# Patient Record
Sex: Male | Born: 1935 | Race: White | Hispanic: No | Marital: Married | Smoking: Former smoker
Health system: Southern US, Community
[De-identification: ages and names within clinical notes are randomized; demographics above are authoritative.]

## PROBLEM LIST (undated history)

## (undated) DIAGNOSIS — I2510 Atherosclerotic heart disease of native coronary artery without angina pectoris: Secondary | ICD-10-CM

## (undated) DIAGNOSIS — I10 Essential (primary) hypertension: Secondary | ICD-10-CM

## (undated) DIAGNOSIS — E119 Type 2 diabetes mellitus without complications: Secondary | ICD-10-CM

## (undated) DIAGNOSIS — K859 Acute pancreatitis without necrosis or infection, unspecified: Secondary | ICD-10-CM

## (undated) DIAGNOSIS — K7460 Unspecified cirrhosis of liver: Secondary | ICD-10-CM

## (undated) DIAGNOSIS — I714 Abdominal aortic aneurysm, without rupture, unspecified: Secondary | ICD-10-CM

## (undated) DIAGNOSIS — E785 Hyperlipidemia, unspecified: Secondary | ICD-10-CM

## (undated) DIAGNOSIS — I509 Heart failure, unspecified: Secondary | ICD-10-CM

## (undated) DIAGNOSIS — I4891 Unspecified atrial fibrillation: Secondary | ICD-10-CM

## (undated) DIAGNOSIS — R001 Bradycardia, unspecified: Secondary | ICD-10-CM

## (undated) HISTORY — PX: ENDOVASCULAR REPAIR/STENT GRAFT: CATH118280

## (undated) HISTORY — PX: CORONARY ARTERY BYPASS GRAFT: SHX141

## (undated) HISTORY — DX: Unspecified cirrhosis of liver: K74.60

## (undated) HISTORY — PX: COLONOSCOPY: SHX174

---

## 2004-06-28 ENCOUNTER — Ambulatory Visit: Payer: Self-pay | Admitting: Unknown Physician Specialty

## 2004-07-05 ENCOUNTER — Other Ambulatory Visit: Payer: Self-pay

## 2004-07-06 ENCOUNTER — Inpatient Hospital Stay: Payer: Self-pay | Admitting: Unknown Physician Specialty

## 2004-07-21 ENCOUNTER — Ambulatory Visit: Payer: Self-pay | Admitting: Ophthalmology

## 2004-07-28 ENCOUNTER — Ambulatory Visit: Payer: Self-pay | Admitting: Ophthalmology

## 2004-08-17 ENCOUNTER — Ambulatory Visit: Payer: Self-pay | Admitting: Internal Medicine

## 2004-09-02 ENCOUNTER — Ambulatory Visit: Payer: Self-pay | Admitting: Ophthalmology

## 2004-09-06 ENCOUNTER — Ambulatory Visit: Payer: Self-pay | Admitting: Internal Medicine

## 2004-09-08 ENCOUNTER — Ambulatory Visit: Payer: Self-pay | Admitting: Ophthalmology

## 2009-09-07 ENCOUNTER — Inpatient Hospital Stay: Payer: Self-pay | Admitting: Internal Medicine

## 2009-09-23 ENCOUNTER — Ambulatory Visit: Payer: Self-pay

## 2009-10-01 ENCOUNTER — Ambulatory Visit: Payer: Self-pay | Admitting: Unknown Physician Specialty

## 2009-11-11 ENCOUNTER — Ambulatory Visit: Payer: Self-pay | Admitting: Surgery

## 2009-11-13 LAB — PATHOLOGY REPORT

## 2012-08-06 ENCOUNTER — Ambulatory Visit: Payer: Self-pay | Admitting: Cardiology

## 2012-08-06 LAB — CBC WITH DIFFERENTIAL/PLATELET
Basophil %: 1.1 %
Eosinophil #: 0.2 10*3/uL (ref 0.0–0.7)
HCT: 34.4 % — ABNORMAL LOW (ref 40.0–52.0)
HGB: 11.3 g/dL — ABNORMAL LOW (ref 13.0–18.0)
Lymphocyte #: 0.8 10*3/uL — ABNORMAL LOW (ref 1.0–3.6)
MCH: 33.3 pg (ref 26.0–34.0)
MCHC: 33 g/dL (ref 32.0–36.0)
MCV: 101 fL — ABNORMAL HIGH (ref 80–100)
Monocyte #: 0.7 x10 3/mm (ref 0.2–1.0)
Neutrophil %: 61 %
WBC: 4.6 10*3/uL (ref 3.8–10.6)

## 2012-08-06 LAB — PROTIME-INR: INR: 1.2

## 2012-08-06 LAB — BASIC METABOLIC PANEL
Anion Gap: 4 — ABNORMAL LOW (ref 7–16)
Calcium, Total: 8.2 mg/dL — ABNORMAL LOW (ref 8.5–10.1)
Chloride: 106 mmol/L (ref 98–107)
Co2: 29 mmol/L (ref 21–32)
Creatinine: 1.13 mg/dL (ref 0.60–1.30)
EGFR (African American): >60
Glucose: 106 mg/dL — ABNORMAL HIGH (ref 65–99)
Osmolality: 280 (ref 275–301)
Potassium: 4.6 mmol/L (ref 3.5–5.1)
Sodium: 139 mmol/L (ref 136–145)

## 2012-08-09 ENCOUNTER — Ambulatory Visit: Payer: Self-pay | Admitting: Cardiology

## 2014-08-29 NOTE — Op Note (Signed)
PATIENT NAME:  Kyle Rollins, Kyle Rollins MR#:  [id number] DATE OF BIRTH:  02/16/1928  DATE OF PROCEDURE:  08/09/2012  PRIMARY CARE PHYSICIAN: Reola MosherAndrew S. Randa LynnLamb, MD  PREPROCEDURE DIAGNOSIS: Sick sinus syndrome, atrial fibrillation.   PROCEDURE: Single-chamber pacemaker implantation.   POSTPROCEDURE DIAGNOSIS: Ventricular pacing.   INDICATION: The patient is an 81 year old gentleman with known history of coronary artery disease. The patient has chronic atrial fibrillation. Recently, the patient has been experiencing generalized fatigue and exertional dyspnea. The patient was noted to be bradycardic, in atrial fibrillation. Recent Holter monitor revealed a mean heart rate of 51 bpm with a maximum heart rate of only 62 bpm. The risks, benefits and alternatives of permanent pacemaker implantation were explained to the patient, and informed written consent was obtained.   DESCRIPTION OF PROCEDURE: He was brought to the operating room in a fasting state. The left pectoral region was prepped and draped in the usual sterile manner. Anesthesia was obtained with 1% Xylocaine locally. A 6 cm incision was performed over the left pectoral region. The pacemaker pocket was generated by electrocautery and blunt dissection. Access was obtained to the left subclavian vein by fine needle aspiration. A Medtronic ventricular lead (5076) was positioned into the right ventricular apex. After proper threshold was obtained, the lead was sutured in place. The pacemaker pocket was irrigated with gentamicin solution. The lead was connected to a rate-responsive single-chamber pacemaker generator (Medtronic Adapta ADSR-1) and positioned into the pocket. The pocket was closed with 2-0 and 4-0 Vicryl, respectively. Steri-Strips and pressure dressing were applied.   ____________________________ Marcina MillardAlexander Kalup Jaquith, MD ap:OSi D: 08/09/2012 13:34:26 ET T: 08/09/2012 13:47:42 ET JOB#: [id number]  cc: Marcina MillardAlexander Camren Lipsett, MD, <Dictator> Marcina MillardALEXANDER  Solomia Harrell MD ELECTRONICALLY SIGNED 08/14/2012 12:38

## 2017-03-03 ENCOUNTER — Inpatient Hospital Stay
Admission: EM | Admit: 2017-03-03 | Discharge: 2017-03-06 | DRG: 292 | Disposition: A | Discharge status: Home Health | Payer: Medicare Other | Attending: Internal Medicine | Admitting: Internal Medicine

## 2017-03-03 ENCOUNTER — Emergency Department: Payer: Medicare Other

## 2017-03-03 ENCOUNTER — Encounter: Payer: Self-pay | Admitting: Intensive Care

## 2017-03-03 DIAGNOSIS — I25119 Atherosclerotic heart disease of native coronary artery with unspecified angina pectoris: Secondary | ICD-10-CM | POA: Diagnosis present

## 2017-03-03 DIAGNOSIS — I110 Hypertensive heart disease with heart failure: Secondary | ICD-10-CM | POA: Diagnosis not present

## 2017-03-03 DIAGNOSIS — I4891 Unspecified atrial fibrillation: Secondary | ICD-10-CM | POA: Diagnosis present

## 2017-03-03 DIAGNOSIS — Z87891 Personal history of nicotine dependence: Secondary | ICD-10-CM | POA: Diagnosis not present

## 2017-03-03 DIAGNOSIS — Z8249 Family history of ischemic heart disease and other diseases of the circulatory system: Secondary | ICD-10-CM

## 2017-03-03 DIAGNOSIS — Z951 Presence of aortocoronary bypass graft: Secondary | ICD-10-CM | POA: Diagnosis not present

## 2017-03-03 DIAGNOSIS — N179 Acute kidney failure, unspecified: Secondary | ICD-10-CM | POA: Diagnosis present

## 2017-03-03 DIAGNOSIS — Z950 Presence of cardiac pacemaker: Secondary | ICD-10-CM | POA: Diagnosis not present

## 2017-03-03 DIAGNOSIS — E785 Hyperlipidemia, unspecified: Secondary | ICD-10-CM | POA: Diagnosis present

## 2017-03-03 DIAGNOSIS — R531 Weakness: Secondary | ICD-10-CM

## 2017-03-03 DIAGNOSIS — I5023 Acute on chronic systolic (congestive) heart failure: Secondary | ICD-10-CM | POA: Diagnosis present

## 2017-03-03 DIAGNOSIS — Z7901 Long term (current) use of anticoagulants: Secondary | ICD-10-CM | POA: Diagnosis not present

## 2017-03-03 DIAGNOSIS — Z79899 Other long term (current) drug therapy: Secondary | ICD-10-CM | POA: Diagnosis not present

## 2017-03-03 DIAGNOSIS — I5043 Acute on chronic combined systolic (congestive) and diastolic (congestive) heart failure: Secondary | ICD-10-CM | POA: Diagnosis present

## 2017-03-03 DIAGNOSIS — I255 Ischemic cardiomyopathy: Secondary | ICD-10-CM | POA: Diagnosis present

## 2017-03-03 HISTORY — DX: Heart failure, unspecified: I50.9

## 2017-03-03 HISTORY — DX: Hyperlipidemia, unspecified: E78.5

## 2017-03-03 LAB — CBC WITH DIFFERENTIAL/PLATELET
Basophils Absolute: 0 10*3/uL (ref 0–0.1)
Basophils Relative: 1 %
Eosinophils Absolute: 0.1 10*3/uL (ref 0–0.7)
Eosinophils Relative: 2 %
HEMATOCRIT: 35.9 % — AB (ref 40.0–52.0)
HEMOGLOBIN: 12 g/dL — AB (ref 13.0–18.0)
LYMPHS ABS: 0.5 10*3/uL — AB (ref 1.0–3.6)
LYMPHS PCT: 9 %
MCH: 37.2 pg — AB (ref 26.0–34.0)
MCHC: 33.4 g/dL (ref 32.0–36.0)
MCV: 111.4 fL — AB (ref 80.0–100.0)
MONOS PCT: 12 %
Monocytes Absolute: 0.7 10*3/uL (ref 0.2–1.0)
NEUTROS ABS: 4.1 10*3/uL (ref 1.4–6.5)
NEUTROS PCT: 76 %
Platelets: 104 10*3/uL — ABNORMAL LOW (ref 150–440)
RBC: 3.22 MIL/uL — ABNORMAL LOW (ref 4.40–5.90)
RDW: 15.3 % — ABNORMAL HIGH (ref 11.5–14.5)
WBC: 5.4 10*3/uL (ref 3.8–10.6)

## 2017-03-03 LAB — URINALYSIS, COMPLETE (UACMP) WITH MICROSCOPIC
Bacteria, UA: NONE SEEN
Bilirubin Urine: NEGATIVE
Glucose, UA: NEGATIVE mg/dL
Hgb urine dipstick: NEGATIVE
KETONES UR: NEGATIVE mg/dL
Leukocytes, UA: NEGATIVE
NITRITE: NEGATIVE
PH: 5 (ref 5.0–8.0)
Protein, ur: 30 mg/dL — AB
SPECIFIC GRAVITY, URINE: 1.021 (ref 1.005–1.030)

## 2017-03-03 LAB — COMPREHENSIVE METABOLIC PANEL
ALBUMIN: 2.6 g/dL — AB (ref 3.5–5.0)
ALT: 17 U/L (ref 17–63)
AST: 49 U/L — AB (ref 15–41)
Alkaline Phosphatase: 60 U/L (ref 38–126)
Anion gap: 9 (ref 5–15)
BUN: 30 mg/dL — AB (ref 6–20)
CHLORIDE: 101 mmol/L (ref 101–111)
CO2: 25 mmol/L (ref 22–32)
CREATININE: 1.32 mg/dL — AB (ref 0.61–1.24)
Calcium: 8.2 mg/dL — ABNORMAL LOW (ref 8.9–10.3)
GFR calc Af Amer: 54 mL/min — ABNORMAL LOW (ref >60)
GFR calc non Af Amer: 47 mL/min — ABNORMAL LOW (ref >60)
GLUCOSE: 138 mg/dL — AB (ref 65–99)
POTASSIUM: 3.6 mmol/L (ref 3.5–5.1)
Sodium: 135 mmol/L (ref 135–145)
Total Bilirubin: 0.8 mg/dL (ref 0.3–1.2)
Total Protein: 7.2 g/dL (ref 6.5–8.1)

## 2017-03-03 LAB — BRAIN NATRIURETIC PEPTIDE: B Natriuretic Peptide: 1025 pg/mL — ABNORMAL HIGH (ref 0.0–100.0)

## 2017-03-03 LAB — PROTIME-INR
INR: 1.29
Prothrombin Time: 16 seconds — ABNORMAL HIGH (ref 11.4–15.2)

## 2017-03-03 LAB — TROPONIN I: Troponin I: <0.03 ng/mL (ref <0.03)

## 2017-03-03 MED ORDER — FUROSEMIDE 10 MG/ML IJ SOLN
40.0000 mg | Freq: Once | INTRAMUSCULAR | Status: AC
  Administered 2017-03-03: 40 mg via INTRAVENOUS
  Filled 2017-03-03: qty 4

## 2017-03-03 NOTE — ED Notes (Signed)
Pt given urinal to uriante and instructed not to stand.

## 2017-03-03 NOTE — ED Provider Notes (Signed)
[facility] Emergency Department Provider Note   ____________________________________________   First MD Initiated Contact with Patient 03/03/17 1846     (approximate)  I have reviewed the triage vital signs and the nursing notes.   HISTORY  Chief Complaint Weakness    HPI Kyle FETCH Sr. is a 83 y.o. male Reports 3 weeks of feeling progressively weaker and short of breath when he walks. He is having trouble moving around his legs are somewhat swollen also reports his dog scratched him on the left arm and not swollen with left elbow is swollen is not running a fever he is coughing up some yellow phlegm from time to time he denies any chest pain or tightness   Past Medical History:  Diagnosis Date  . CHF (congestive heart failure) (HCC)   . Hyperlipidemia     There are no active problems to display for this patient.   History reviewed. No pertinent surgical history.  Prior to Admission medications   Not on File    Allergies Patient has no known allergies.  History reviewed. No pertinent family history.  Social History Social History  Substance Use Topics  . Smoking status: Former Smoker    Types: Cigarettes  . Smokeless tobacco: Former Neurosurgeon    Types: Snuff  . Alcohol use No    Review of Systems  Constitutional: No fever/chills Eyes: No visual changes. ENT: No sore throat. Cardiovascular: Denies chest pain. Respiratory: some shortness of breath. Gastrointestinal: No abdominal pain.  No nausea, no vomiting.  No diarrhea.  No constipation. Genitourinary: Negative for dysuria. Musculoskeletal: Negative for back pain. Skin: Negative for rash. Neurological: Negative for headaches, focal weakness    ____________________________________________   PHYSICAL EXAM:  VITAL SIGNS: ED Triage Vitals  Enc Vitals Group     BP 03/03/17 1841 (!) 143/76     Pulse Rate 03/03/17 1841 78     Resp 03/03/17 1841 (!) 22     Temp 03/03/17  1841 97.9 F (36.6 C)     Temp Source 03/03/17 1841 Oral     SpO2 03/03/17 1841 97 %     Weight 03/03/17 1841 188 lb (85.3 kg)     Height 03/03/17 1841 5\' 5"  (1.651 m)     Head Circumference --      Peak Flow --      Pain Score 03/03/17 1840 5     Pain Loc --      Pain Edu? --      Excl. in GC? --     Constitutional: Alert and oriented. Well appearing and in no acute distress. Eyes: Conjunctivae are normal. I. Head: Atraumatic. Nose: No congestion/rhinnorhea. Mouth/Throat: Mucous membranes are moist.  Oropharynx non-erythematous. Neck: No stridor.   Cardiovascular: Normal rate, regular rhythm. Grossly normal heart sounds.  Good peripheral circulation. Respiratory: Normal respiratory effort.  No retractions. Lungs crackles two thirds of the way up left base has decreased breath sounds Gastrointestinal: Soft and nontender. No distention. No abdominal bruits. No CVA tenderness. Musculoskeletal: No lower extremity tenderness positive edema bilaterally  Neurologic:  Normal speech and language. No gross focal neurologic deficits are appreciated. Skin:  Skin is warm, dry and intact. No rash noted. Psychiatric: Mood and affect are normal. Speech and behavior are normal.  ____________________________________________   LABS (all labs ordered are listed, but only abnormal results are displayed)  Labs Reviewed  COMPREHENSIVE METABOLIC PANEL - Abnormal; Notable for the following:       Result Value  Glucose, Bld 138 (*)    BUN 30 (*)    Creatinine, Ser 1.32 (*)    Calcium 8.2 (*)    Albumin 2.6 (*)    AST 49 (*)    GFR calc non Af Amer 47 (*)    GFR calc Af Amer 54 (*)    All other components within normal limits  BRAIN NATRIURETIC PEPTIDE - Abnormal; Notable for the following:    B Natriuretic Peptide 1,025.0 (*)    All other components within normal limits  CBC WITH DIFFERENTIAL/PLATELET - Abnormal; Notable for the following:    RBC 3.22 (*)    Hemoglobin 12.0 (*)    HCT 35.9  (*)    MCV 111.4 (*)    MCH 37.2 (*)    RDW 15.3 (*)    Platelets 104 (*)    Lymphs Abs 0.5 (*)    All other components within normal limits  URINALYSIS, COMPLETE (UACMP) WITH MICROSCOPIC - Abnormal; Notable for the following:    Color, Urine YELLOW (*)    APPearance CLEAR (*)    Protein, ur 30 (*)    Squamous Epithelial / LPF 0-5 (*)    All other components within normal limits  PROTIME-INR - Abnormal; Notable for the following:    Prothrombin Time 16.0 (*)    All other components within normal limits  TROPONIN I   ____________________________________________  EKG  EKG read and interpreted by me shows a paced rhythm rate of 80 no acute changes are seen ____________________________________________  RADIOLOGY    ____________________________________________   PROCEDURES  Procedure(s) performed:   Procedures  Critical Care performed:  ____________________________________________   INITIAL IMPRESSION / ASSESSMENT AND PLAN / ED COURSE  As part of my medical decision making, I reviewed the following data within the electronic MEDICAL RECORD NUMBER     patient's past medical history from care everywhere [facility] was reviewed the shows he has shortness of breath pacemaker diabetes atherosclerosis hypertension hyperlipidemia: Polyps and AAA pancreatitis congestive failure A. fib etc. patient is too weak to get up out of the bed even with the help from the nurse his leg is weepy on attempting to sit up he got dizzy and short of breath although he did not desaturate. He cannot be cared for at home in his present condition. He'll attempt to reduce his fluid load with some Lasix and admission.   ____________________________________________   FINAL CLINICAL IMPRESSION(S) / ED DIAGNOSES  Final diagnoses:  Weakness  Acute on chronic combined systolic and diastolic congestive heart failure (HCC)      NEW MEDICATIONS STARTED DURING THIS VISIT:  New Prescriptions   No medications  on file     Note:  This document was prepared using Dragon voice recognition software and may include unintentional dictation errors.    Arnaldo NatalMalinda, Jayla Mackie F, MD 03/03/17 2113

## 2017-03-03 NOTE — ED Notes (Signed)
Family at bedside. 

## 2017-03-03 NOTE — ED Notes (Signed)
Pants removed and pt assisted to standing to the side of the bed with this RN and pt son. Pt having a difficult time. Pt output is 30ml and is dark in color. Pt has 2 plus pitting edema to lower extremities bilaterally. Wife states pt has not been drinking like he should.

## 2017-03-03 NOTE — ED Triage Notes (Signed)
Patient arrived by EMS from home for c/o weakness to extremities X3 weeks that has gradually gotten worse. Patient saw PCP recently and he told patient to come to ER. Patient hs labored breathing on and off. Denies N/V/D. Reports decreased appetite. No fevers. EMS blood sugar 159. Patient takes warfarin daily but his wife states he hasnt taken it in 5 days. HX [facility]

## 2017-03-03 NOTE — ED Notes (Signed)
Attempted to get pt to stand to see how his sats would tolerate. Pt unable to get to a sitting position with this RN assistance. Pt got dizzy and weak. Weeping noted to the posterior right leg. O2 sats remained in the 90s however pt is too weak to stand.

## 2017-03-03 NOTE — ED Notes (Signed)
Patient is resting comfortably. 

## 2017-03-04 LAB — BASIC METABOLIC PANEL
Anion gap: 9 (ref 5–15)
BUN: 29 mg/dL — ABNORMAL HIGH (ref 6–20)
CO2: 24 mmol/L (ref 22–32)
Calcium: 8 mg/dL — ABNORMAL LOW (ref 8.9–10.3)
Chloride: 104 mmol/L (ref 101–111)
Creatinine, Ser: 1.07 mg/dL (ref 0.61–1.24)
GFR calc Af Amer: >60 mL/min (ref >60)
GFR calc non Af Amer: >60 mL/min (ref >60)
Glucose, Bld: 93 mg/dL (ref 65–99)
Potassium: 3.5 mmol/L (ref 3.5–5.1)
Sodium: 137 mmol/L (ref 135–145)

## 2017-03-04 LAB — PROTIME-INR
INR: 1.37
Prothrombin Time: 16.8 seconds — ABNORMAL HIGH (ref 11.4–15.2)

## 2017-03-04 LAB — TSH: TSH: 4.09 u[IU]/mL (ref 0.350–4.500)

## 2017-03-04 MED ORDER — ISOSORBIDE DINITRATE 30 MG PO TABS
30.0000 mg | ORAL_TABLET | Freq: Three times a day (TID) | ORAL | Status: DC
  Administered 2017-03-04 – 2017-03-06 (×8): 30 mg via ORAL
  Filled 2017-03-04 (×9): qty 1

## 2017-03-04 MED ORDER — FUROSEMIDE 10 MG/ML IJ SOLN
60.0000 mg | Freq: Four times a day (QID) | INTRAMUSCULAR | Status: DC
  Administered 2017-03-04: 60 mg via INTRAVENOUS
  Filled 2017-03-04: qty 6

## 2017-03-04 MED ORDER — WARFARIN - PHARMACIST DOSING INPATIENT: Freq: Every day | Status: DC

## 2017-03-04 MED ORDER — FUROSEMIDE 10 MG/ML IJ SOLN
20.0000 mg | Freq: Three times a day (TID) | INTRAMUSCULAR | Status: DC
  Administered 2017-03-04 – 2017-03-06 (×7): 20 mg via INTRAVENOUS
  Filled 2017-03-04 (×7): qty 2

## 2017-03-04 MED ORDER — ACETAMINOPHEN 650 MG RE SUPP: 650.0000 mg | Freq: Four times a day (QID) | RECTAL | Status: DC | PRN

## 2017-03-04 MED ORDER — WARFARIN SODIUM 1 MG PO TABS: 1.0000 mg | ORAL_TABLET | ORAL | Status: DC

## 2017-03-04 MED ORDER — WARFARIN SODIUM 2 MG PO TABS: 2.0000 mg | ORAL_TABLET | ORAL | Status: DC

## 2017-03-04 MED ORDER — WARFARIN SODIUM 2 MG PO TABS
2.0000 mg | ORAL_TABLET | ORAL | Status: DC
  Administered 2017-03-04: 2 mg via ORAL
  Filled 2017-03-04 (×2): qty 1

## 2017-03-04 MED ORDER — ONDANSETRON HCL 4 MG PO TABS: 4.0000 mg | ORAL_TABLET | Freq: Four times a day (QID) | ORAL | Status: DC | PRN

## 2017-03-04 MED ORDER — WARFARIN SODIUM 2 MG PO TABS: 2.0000 mg | ORAL_TABLET | Freq: Every day | ORAL | Status: DC

## 2017-03-04 MED ORDER — PRAVASTATIN SODIUM 40 MG PO TABS
80.0000 mg | ORAL_TABLET | Freq: Every day | ORAL | Status: DC
  Administered 2017-03-04 – 2017-03-05 (×2): 80 mg via ORAL
  Filled 2017-03-04 (×2): qty 2

## 2017-03-04 MED ORDER — NITROGLYCERIN 0.4 MG SL SUBL
0.4000 mg | SUBLINGUAL_TABLET | SUBLINGUAL | Status: DC | PRN
  Administered 2017-03-04 – 2017-03-05 (×2): 0.4 mg via SUBLINGUAL
  Filled 2017-03-04 (×2): qty 1

## 2017-03-04 MED ORDER — DOCUSATE SODIUM 100 MG PO CAPS
100.0000 mg | ORAL_CAPSULE | Freq: Two times a day (BID) | ORAL | Status: DC
  Administered 2017-03-04 – 2017-03-06 (×5): 100 mg via ORAL
  Filled 2017-03-04 (×6): qty 1

## 2017-03-04 MED ORDER — BACITRACIN ZINC 500 UNIT/GM EX OINT
1.0000 "application " | TOPICAL_OINTMENT | Freq: Three times a day (TID) | CUTANEOUS | Status: DC
  Administered 2017-03-04 – 2017-03-06 (×6): 1 via TOPICAL
  Filled 2017-03-04 (×2): qty 28.35

## 2017-03-04 MED ORDER — HEPARIN SODIUM (PORCINE) 5000 UNIT/ML IJ SOLN
5000.0000 [IU] | Freq: Three times a day (TID) | INTRAMUSCULAR | Status: DC
  Administered 2017-03-04 – 2017-03-06 (×7): 5000 [IU] via SUBCUTANEOUS
  Filled 2017-03-04 (×7): qty 1

## 2017-03-04 MED ORDER — SODIUM CHLORIDE 0.9 % IV SOLN: 250.0000 mL | INTRAVENOUS | Status: DC | PRN

## 2017-03-04 MED ORDER — SODIUM CHLORIDE 0.9% FLUSH: 3.0000 mL | INTRAVENOUS | Status: DC | PRN

## 2017-03-04 MED ORDER — ACETAMINOPHEN 325 MG PO TABS
650.0000 mg | ORAL_TABLET | Freq: Four times a day (QID) | ORAL | Status: DC | PRN
  Administered 2017-03-04 – 2017-03-05 (×2): 650 mg via ORAL
  Filled 2017-03-04 (×2): qty 2

## 2017-03-04 MED ORDER — ONDANSETRON HCL 4 MG/2ML IJ SOLN: 4.0000 mg | Freq: Four times a day (QID) | INTRAMUSCULAR | Status: DC | PRN

## 2017-03-04 MED ORDER — METOPROLOL TARTRATE 25 MG PO TABS
25.0000 mg | ORAL_TABLET | Freq: Two times a day (BID) | ORAL | Status: DC
  Administered 2017-03-04 – 2017-03-06 (×6): 25 mg via ORAL
  Filled 2017-03-04 (×6): qty 1

## 2017-03-04 MED ORDER — SODIUM CHLORIDE 0.9% FLUSH
3.0000 mL | Freq: Two times a day (BID) | INTRAVENOUS | Status: DC
  Administered 2017-03-04 – 2017-03-06 (×4): 3 mL via INTRAVENOUS

## 2017-03-04 NOTE — Progress Notes (Signed)
ANTICOAGULATION CONSULT NOTE - Initial Consult  Pharmacy Consult for warfarin Indication: atrial fibrillation  No Known Allergies  Patient Measurements: Height: 5\' 5"  (165.1 cm) Weight: 182 lb 4.8 oz (82.7 kg) IBW/kg (Calculated) : 61.5 Heparin Dosing Weight: 82.7 kg  Vital Signs: Temp: 97.6 F (36.4 C) (10/27 0042) Temp Source: Oral (10/27 0042) BP: 120/66 (10/27 0042) Pulse Rate: 67 (10/27 0042)  Labs:  Recent Labs  03/03/17 1848  HGB 12.0*  HCT 35.9*  PLT 104*  LABPROT 16.0*  INR 1.29  CREATININE 1.32*  TROPONINI <0.03    Estimated Creatinine Clearance: 39 mL/min (A) (by C-G formula based on SCr of 1.32 mg/dL (H)).   Medical History: Past Medical History:  Diagnosis Date  . CHF (congestive heart failure) (HCC)   . Hyperlipidemia     Medications:  Scheduled:  . bacitracin  1 application Topical TID  . docusate sodium  100 mg Oral BID  . furosemide  60 mg Intravenous Q6H  . heparin  5,000 Units Subcutaneous Q8H  . isosorbide dinitrate  30 mg Oral TID  . metoprolol tartrate  25 mg Oral BID  . pravastatin  80 mg Oral q1800  . [START ON 03/06/2017] warfarin  1 mg Oral Q Mon-1800   And  . [START ON 03/08/2017] warfarin  1 mg Oral Q Wed-1800  . warfarin  2 mg Oral Q T,Th,S,Su-1800   And  . [START ON 03/10/2017] warfarin  2 mg Oral Q Fri-1800  . Warfarin - Pharmacist Dosing Inpatient   Does not apply q1800    Assessment: Patient admitted for weakness and SOB shown to have some congestion on CXR. Patient takes warfarin PTA for chronic afib. Warfarin 2 mg TThFSSun Warfarin 1 mg MW  10/26 INR 1.29 INR is currently therapeutic.  Goal of Therapy:  INR 2-3 Monitor platelets by anticoagulation protocol: Yes   Plan:  Will restart patient's warfarin 2mg  daily and warfarin 1mg  on MW. Will monitor CBC and INRs daily. Will adjusted based on INRs.  Thomasene Rippleavid Zamiah Tollett, PharmD, BCPS Clinical Pharmacist 03/04/2017

## 2017-03-04 NOTE — Progress Notes (Signed)
[facility] [place] [facility]   PATIENT NAME: Kyle Rollins    MR#:  [id number]  DATE OF BIRTH:  [birth year]  SUBJECTIVE:   Came in with weakness and increasing sob REVIEW OF SYSTEMS:   Review of Systems  Constitutional: Negative for chills, fever and weight loss.  HENT: Negative for ear discharge, ear pain and nosebleeds.   Eyes: Negative for blurred vision, pain and discharge.  Respiratory: Positive for shortness of breath. Negative for sputum production, wheezing and stridor.   Cardiovascular: Positive for leg swelling. Negative for chest pain, palpitations, orthopnea and PND.  Gastrointestinal: Negative for abdominal pain, diarrhea, nausea and vomiting.  Genitourinary: Negative for frequency and urgency.  Musculoskeletal: Negative for back pain and joint pain.  Neurological: Positive for weakness. Negative for sensory change, speech change and focal weakness.  Psychiatric/Behavioral: Negative for depression and hallucinations. The patient is not nervous/anxious.    Tolerating Diet: Tolerating PT:   DRUG ALLERGIES:  No Known Allergies  VITALS:  Blood pressure (!) 120/55, pulse 66, temperature 97.7 F (36.5 C), temperature source Oral, resp. rate 18, height 5\' 5"  (1.651 m), weight 82.6 kg (182 lb), SpO2 95 %.  PHYSICAL EXAMINATION:   Physical Exam  GENERAL:  83 y.o.-year-old patient lying in the bed with no acute distress.  EYES: Pupils equal, round, reactive to light and accommodation. No scleral icterus. Extraocular muscles intact.  HEENT: Head atraumatic, normocephalic. Oropharynx and nasopharynx clear.  NECK:  Supple, no jugular venous distention. No thyroid enlargement, no tenderness.  LUNGS: decreasedl breath sounds bilaterally, no wheezing, rales, rhonchi. No use of accessory muscles of respiration.  CARDIOVASCULAR: S1, S2 normal. No murmurs, rubs, or gallops.  ABDOMEN: Soft, nontender, nondistended. Bowel sounds present. No  organomegaly or mass. Scrotal edema EXTREMITIES: No cyanosis, clubbing or edema b/l.    NEUROLOGIC: Cranial nerves II through XII are intact. No focal Motor or sensory deficits b/l.   PSYCHIATRIC:  patient is alert and oriented x 3.  SKIN: No obvious rash, lesion, or ulcer.   LABORATORY PANEL:  CBC  Recent Labs Lab 03/03/17 1848  WBC 5.4  HGB 12.0*  HCT 35.9*  PLT 104*    Chemistries   Recent Labs Lab 03/03/17 1848 03/04/17 0634  NA 135 137  K 3.6 3.5  CL 101 104  CO2 25 24  GLUCOSE 138* 93  BUN 30* 29*  CREATININE 1.32* 1.07  CALCIUM 8.2* 8.0*  AST 49*  --   ALT 17  --   ALKPHOS 60  --   BILITOT 0.8  --    Cardiac Enzymes  Recent Labs Lab 03/03/17 1848  TROPONINI <0.03   RADIOLOGY:  Dg Chest Portable 1 View  Result Date: 03/03/2017 CLINICAL DATA:  Extremity weakness 3 weeks getting worse. Difficulty breathing intermittently. EXAM: PORTABLE CHEST 1 VIEW COMPARISON:  08/09/2012 and 08/06/2012 FINDINGS: Sternotomy wires unchanged. Left-sided pacemaker unchanged. Lungs are hypoinflated without lobar consolidation or effusion. Minimal prominence of the perihilar markings without significant change likely mild chronic vascular congestion. Cardiomediastinal silhouette is within normal. There is calcified plaque over the aortic arch. There are degenerative changes of the spine. IMPRESSION: Hypoinflated lungs with suggestion of minimal chronic vascular congestion. Electronically Signed   By: Elberta Fortis M.D.   On: 03/03/2017 19:16   ASSESSMENT AND PLAN:  83 year old male admitted for acute kidney injury.  1. AKI: with volume overload patient needs diuresis as well as improve cardiac output. We will avoid nephrotoxic agents.  -cont lasix for  now -creatinine better  2. CHF: Acute on chronic; systolic. The patient already has a biventricular pacemaker and ischemic cardiomyopathy. Continue IV Lasix. Consult cardiology with Dr Juliann Parescallwood noted  3. CAD: Stable; continue  Isordil  4. A. fib: Rate controlled; continue warfarin.  5. Hyperlipidemia: Continue statin therapy  6. DVT prophylaxis: As above on coumadin   Case discussed with Care Management/Social Worker. Management plans discussed with the patient, family and they are in agreement.  CODE STATUS: full  DVT Prophylaxis: coumadin  TOTAL TIME TAKING CARE OF THIS PATIENT: 25 minutes.  >50% time spent on counselling and coordination of care  POSSIBLE D/C IN *1-2* DAYS, DEPENDING ON CLINICAL CONDITION.  Note: This dictation was prepared with Dragon dictation along with smaller phrase technology. Any transcriptional errors that result from this process are unintentional.  Urijah Arko M.D on 03/04/2017 at 1:05 PM  Between 7am to 6pm - Pager - [phone]  After 6pm go to [email or web address] - Social research officer, [email or web address] [email or web address]  [facility]  Office  [phone]  CC: Primary care physician; Kandyce RudBabaoff, Marcus, MD

## 2017-03-04 NOTE — H&P (Signed)
Kyle Chandler [name].   Chief Complaint: Weakness HPI: The patient with past medical history of CHF and A. fib presents emergency department complaining of weakness. The patient has been unable to stand today. His mother and brother state that he has been progressively weaker over the last 2 days. The patient complains of leg swelling. He denies chest pain or shortness of breath. Laboratory evaluation in the emergency department showed elevated BNP. He was given Lasix IV in the emergency department to which she responded well. He was also found to have acute kidney injury which prompted emergency department staff to call the hospitalist service for admission.  Past Medical History:  Diagnosis Date  . CHF (congestive heart failure) ([place])   . Hyperlipidemia     Past Surgical History:  Procedure Laterality Date  . CORONARY ARTERY BYPASS GRAFT     x2  . ENDOVASCULAR REPAIR/STENT GRAFT      Family History  Problem Relation Age of Onset  . CAD Father    Social History:  reports that he has quit smoking. His smoking use included Cigarettes. He has quit using smokeless tobacco. His smokeless tobacco use included Snuff. He reports that he does not drink alcohol. His drug history is not on file.  Allergies: No Known Allergies  Medications Prior to Admission  Medication Sig Dispense Refill  . bacitracin ointment Apply 1 application topically 3 (three) times daily.    . furosemide (LASIX) 20 MG tablet Take 1 tablet by mouth daily.    . isosorbide dinitrate (ISORDIL) 30 MG tablet Take 1 tablet by mouth 3 (three) times daily.    Marland Kitchen lovastatin (MEVACOR) 40 MG tablet Take 2 tablets by mouth daily.     . metoprolol tartrate (LOPRESSOR) 25 MG tablet Take 1 tablet by mouth 2 (two) times daily.    . nitroGLYCERIN (NITROSTAT) 0.4 MG SL tablet Place 1 tablet under the tongue. Every 5 minutes as needed for chest pain. May take up to 3 doses.    Marland Kitchen warfarin (COUMADIN) 2 MG tablet Take 1  tablet by mouth daily. Break in half on Monday and Wednesday      Results for orders placed or performed during the hospital encounter of 03/03/17 (from the past 48 hour(s))  Comprehensive metabolic panel     Status: Abnormal   Collection Time: 03/03/17  6:48 PM  Result Value Ref Range   Sodium 135 135 - 145 mmol/L   Potassium 3.6 3.5 - 5.1 mmol/L   Chloride 101 101 - 111 mmol/L   CO2 25 22 - 32 mmol/L   Glucose, Bld 138 (H) 65 - 99 mg/dL   BUN 30 (H) 6 - 20 mg/dL   Creatinine, Ser 1.32 (H) 0.61 - 1.24 mg/dL   Calcium 8.2 (L) 8.9 - 10.3 mg/dL   Total Protein 7.2 6.5 - 8.1 g/dL   Albumin 2.6 (L) 3.5 - 5.0 g/dL   AST 49 (H) 15 - 41 U/L   ALT 17 17 - 63 U/L   Alkaline Phosphatase 60 38 - 126 U/L   Total Bilirubin 0.8 0.3 - 1.2 mg/dL   GFR calc non Af Amer 47 (L) >60 mL/min   GFR calc Af Amer 54 (L) >60 mL/min    Comment: (NOTE) The eGFR has been calculated using the CKD EPI equation. This calculation has not been validated in all clinical situations. eGFR's persistently <60 mL/min signify possible Chronic Kidney Disease.    Anion gap 9 5 -  15  Brain natriuretic peptide     Status: Abnormal   Collection Time: 03/03/17  6:48 PM  Result Value Ref Range   B Natriuretic Peptide 1,025.0 (H) 0.0 - 100.0 pg/mL  Troponin I     Status: None   Collection Time: 03/03/17  6:48 PM  Result Value Ref Range   Troponin I <0.03 <0.03 ng/mL  CBC with Differential     Status: Abnormal   Collection Time: 03/03/17  6:48 PM  Result Value Ref Range   WBC 5.4 3.8 - 10.6 K/uL   RBC 3.22 (L) 4.40 - 5.90 MIL/uL   Hemoglobin 12.0 (L) 13.0 - 18.0 g/dL   HCT 35.9 (L) 40.0 - 52.0 %   MCV 111.4 (H) 80.0 - 100.0 fL   MCH 37.2 (H) 26.0 - 34.0 pg   MCHC 33.4 32.0 - 36.0 g/dL   RDW 15.3 (H) 11.5 - 14.5 %   Platelets 104 (L) 150 - 440 K/uL   Neutrophils Relative % 76 %   Neutro Abs 4.1 1.4 - 6.5 K/uL   Lymphocytes Relative 9 %   Lymphs Abs 0.5 (L) 1.0 - 3.6 K/uL   Monocytes Relative 12 %   Monocytes  Absolute 0.7 0.2 - 1.0 K/uL   Eosinophils Relative 2 %   Eosinophils Absolute 0.1 0 - 0.7 K/uL   Basophils Relative 1 %   Basophils Absolute 0.0 0 - 0.1 K/uL  Protime-INR     Status: Abnormal   Collection Time: 03/03/17  6:48 PM  Result Value Ref Range   Prothrombin Time 16.0 (H) 11.4 - 15.2 seconds   INR 1.29   Urinalysis, Complete w Microscopic     Status: Abnormal   Collection Time: 03/03/17  7:28 PM  Result Value Ref Range   Color, Urine YELLOW (A) YELLOW   APPearance CLEAR (A) CLEAR   Specific Gravity, Urine 1.021 1.005 - 1.030   pH 5.0 5.0 - 8.0   Glucose, UA NEGATIVE NEGATIVE mg/dL   Hgb urine dipstick NEGATIVE NEGATIVE   Bilirubin Urine NEGATIVE NEGATIVE   Ketones, ur NEGATIVE NEGATIVE mg/dL   Protein, ur 30 (A) NEGATIVE mg/dL   Nitrite NEGATIVE NEGATIVE   Leukocytes, UA NEGATIVE NEGATIVE   RBC / HPF 0-5 0 - 5 RBC/hpf   WBC, UA 0-5 0 - 5 WBC/hpf   Bacteria, UA NONE SEEN NONE SEEN   Squamous Epithelial / LPF 0-5 (A) NONE SEEN   Mucus PRESENT    Hyaline Casts, UA PRESENT    Dg Chest Portable 1 View  Result Date: 03/03/2017 CLINICAL DATA:  Extremity weakness 3 weeks getting worse. Difficulty breathing intermittently. EXAM: PORTABLE CHEST 1 VIEW COMPARISON:  08/09/2012 and 08/06/2012 FINDINGS: Sternotomy wires unchanged. Left-sided pacemaker unchanged. Lungs are hypoinflated without lobar consolidation or effusion. Minimal prominence of the perihilar markings without significant change likely mild chronic vascular congestion. Cardiomediastinal silhouette is within normal. There is calcified plaque over the aortic arch. There are degenerative changes of the spine. IMPRESSION: Hypoinflated lungs with suggestion of minimal chronic vascular congestion. Electronically Signed   By: Marin Olp M.D.   On: 03/03/2017 19:16    Review of Systems  Constitutional: Negative for chills and fever.  HENT: Negative for sore throat and tinnitus.   Eyes: Negative for blurred vision and  redness.  Respiratory: Negative for cough and shortness of breath.   Cardiovascular: Positive for leg swelling. Negative for chest pain, palpitations, orthopnea and PND.  Gastrointestinal: Negative for abdominal pain, diarrhea, nausea and vomiting.  Genitourinary: Negative for dysuria, frequency and urgency.  Musculoskeletal: Negative for joint pain and myalgias.  Skin: Negative for rash.       No lesions  Neurological: Positive for weakness. Negative for speech change and focal weakness.  Endo/Heme/Allergies: Does not bruise/bleed easily.       No temperature intolerance  Psychiatric/Behavioral: Negative for depression and suicidal ideas.    Blood pressure 120/66, pulse 67, temperature 97.6 F (36.4 C), temperature source Oral, resp. rate 17, height 5' 5"  (1.651 m), weight 82.7 kg (182 lb 4.8 oz), SpO2 97 %. Physical Exam  Vitals reviewed. Constitutional: He is oriented to person, place, and time. He appears well-developed and well-nourished. No distress.  HENT:  Head: Normocephalic.  Eyes: Pupils are equal, round, and reactive to light. Conjunctivae and EOM are normal. No scleral icterus.  Neck: Normal range of motion. Neck supple. No JVD present. No tracheal deviation present. No thyromegaly present.  Cardiovascular: Normal rate and normal heart sounds.  An irregularly irregular rhythm present. Exam reveals no gallop and no friction rub.   No murmur heard. Respiratory: Effort normal and breath sounds normal. No respiratory distress.  GI: Soft. Bowel sounds are normal. He exhibits no distension. There is no tenderness.  Genitourinary:  Genitourinary Comments: Deferred  Musculoskeletal: Normal range of motion. He exhibits edema.  Lymphadenopathy:    He has no cervical adenopathy.  Neurological: He is alert and oriented to person, place, and time. No cranial nerve deficit.  Skin: Skin is warm and dry. No rash noted. No erythema.  Discoloration consistent with chronic venous stasis   Psychiatric: He has a normal mood and affect. His behavior is normal. Judgment and thought content normal.     Assessment/Plan This is an 83 year old male admitted for acute kidney injury. 1. AKI: Prerenal; patient needs diuresis as well as improve cardiac output. We will avoid nephrotoxic agents. Await cardiology input. 2. CHF: Acute on chronic; systolic. The patient already has a biventricular pacemaker and ischemic cardiomyopathy. Continue IV Lasix. Consult cardiology. 3. CAD: Stable; continue Isordil 4. A. fib: Rate controlled; continue warfarin. 5. Hyperlipidemia: Continue statin therapy 6. DVT prophylaxis: As above 7. GI prophylaxis: None The patient is a full code. Time spent on admission orders and patient care approximately 45 minutes  Harrie Foreman, MD 03/04/2017, 1:01 AM

## 2017-03-04 NOTE — Consult Note (Signed)
Reason for Consult: Congestive heart failure shortness of breath atrial fibrillation generalized weakness Referring Physician: Dr. Rosilyn Mings hospitalist Cardiologist Dr. Inda Coke [name].  HPI: [name] a 83 year old white male with significant congestive heart failure atrial fibrillation sick sinus syndrome. Patient complains of generalized weakness and leg swelling in emergency room was found to have elevated BNP was treated with Lasix with some improvement subsequently admitted to the hospitalist services found to be in atrial fibrillation as well currently on Coumadin for anticoagulation.  Past Medical History:  Diagnosis Date  . CHF (congestive heart failure) ([place])   . Hyperlipidemia     Past Surgical History:  Procedure Laterality Date  . CORONARY ARTERY BYPASS GRAFT     x2  . ENDOVASCULAR REPAIR/STENT GRAFT      Family History  Problem Relation Age of Onset  . CAD Father     Social History:  reports that he has quit smoking. His smoking use included Cigarettes. He has quit using smokeless tobacco. His smokeless tobacco use included Snuff. He reports that he does not drink alcohol. His drug history is not on file.  Allergies: No Known Allergies  Medications: I have reviewed the patient's current medications.  Results for orders placed or performed during the hospital encounter of 03/03/17 (from the past 48 hour(s))  Comprehensive metabolic panel     Status: Abnormal   Collection Time: 03/03/17  6:48 PM  Result Value Ref Range   Sodium 135 135 - 145 mmol/L   Potassium 3.6 3.5 - 5.1 mmol/L   Chloride 101 101 - 111 mmol/L   CO2 25 22 - 32 mmol/L   Glucose, Bld 138 (H) 65 - 99 mg/dL   BUN 30 (H) 6 - 20 mg/dL   Creatinine, Ser 1.32 (H) 0.61 - 1.24 mg/dL   Calcium 8.2 (L) 8.9 - 10.3 mg/dL   Total Protein 7.2 6.5 - 8.1 g/dL   Albumin 2.6 (L) 3.5 - 5.0 g/dL   AST 49 (H) 15 - 41 U/L   ALT 17 17 - 63 U/L   Alkaline Phosphatase 60 38 -  126 U/L   Total Bilirubin 0.8 0.3 - 1.2 mg/dL   GFR calc non Af Amer 47 (L) >60 mL/min   GFR calc Af Amer 54 (L) >60 mL/min    Comment: (NOTE) The eGFR has been calculated using the CKD EPI equation. This calculation has not been validated in all clinical situations. eGFR's persistently <60 mL/min signify possible Chronic Kidney Disease.    Anion gap 9 5 - 15  Brain natriuretic peptide     Status: Abnormal   Collection Time: 03/03/17  6:48 PM  Result Value Ref Range   B Natriuretic Peptide 1,025.0 (H) 0.0 - 100.0 pg/mL  Troponin I     Status: None   Collection Time: 03/03/17  6:48 PM  Result Value Ref Range   Troponin I <0.03 <0.03 ng/mL  CBC with Differential     Status: Abnormal   Collection Time: 03/03/17  6:48 PM  Result Value Ref Range   WBC 5.4 3.8 - 10.6 K/uL   RBC 3.22 (L) 4.40 - 5.90 MIL/uL   Hemoglobin 12.0 (L) 13.0 - 18.0 g/dL   HCT 35.9 (L) 40.0 - 52.0 %   MCV 111.4 (H) 80.0 - 100.0 fL   MCH 37.2 (H) 26.0 - 34.0 pg   MCHC 33.4 32.0 - 36.0 g/dL   RDW 15.3 (H) 11.5 - 14.5 %  Platelets 104 (L) 150 - 440 K/uL   Neutrophils Relative % 76 %   Neutro Abs 4.1 1.4 - 6.5 K/uL   Lymphocytes Relative 9 %   Lymphs Abs 0.5 (L) 1.0 - 3.6 K/uL   Monocytes Relative 12 %   Monocytes Absolute 0.7 0.2 - 1.0 K/uL   Eosinophils Relative 2 %   Eosinophils Absolute 0.1 0 - 0.7 K/uL   Basophils Relative 1 %   Basophils Absolute 0.0 0 - 0.1 K/uL  Protime-INR     Status: Abnormal   Collection Time: 03/03/17  6:48 PM  Result Value Ref Range   Prothrombin Time 16.0 (H) 11.4 - 15.2 seconds   INR 1.29   TSH     Status: None   Collection Time: 03/03/17  6:48 PM  Result Value Ref Range   TSH 4.090 0.350 - 4.500 uIU/mL    Comment: Performed by a 3rd Generation assay with a functional sensitivity of <=0.01 uIU/mL.  Urinalysis, Complete w Microscopic     Status: Abnormal   Collection Time: 03/03/17  7:28 PM  Result Value Ref Range   Color, Urine YELLOW (A) YELLOW   APPearance CLEAR  (A) CLEAR   Specific Gravity, Urine 1.021 1.005 - 1.030   pH 5.0 5.0 - 8.0   Glucose, UA NEGATIVE NEGATIVE mg/dL   Hgb urine dipstick NEGATIVE NEGATIVE   Bilirubin Urine NEGATIVE NEGATIVE   Ketones, ur NEGATIVE NEGATIVE mg/dL   Protein, ur 30 (A) NEGATIVE mg/dL   Nitrite NEGATIVE NEGATIVE   Leukocytes, UA NEGATIVE NEGATIVE   RBC / HPF 0-5 0 - 5 RBC/hpf   WBC, UA 0-5 0 - 5 WBC/hpf   Bacteria, UA NONE SEEN NONE SEEN   Squamous Epithelial / LPF 0-5 (A) NONE SEEN   Mucus PRESENT    Hyaline Casts, UA PRESENT   Protime-INR     Status: Abnormal   Collection Time: 03/04/17  6:34 AM  Result Value Ref Range   Prothrombin Time 16.8 (H) 11.4 - 15.2 seconds   INR 2.67   Basic metabolic panel     Status: Abnormal   Collection Time: 03/04/17  6:34 AM  Result Value Ref Range   Sodium 137 135 - 145 mmol/L   Potassium 3.5 3.5 - 5.1 mmol/L   Chloride 104 101 - 111 mmol/L   CO2 24 22 - 32 mmol/L   Glucose, Bld 93 65 - 99 mg/dL   BUN 29 (H) 6 - 20 mg/dL   Creatinine, Ser 1.07 0.61 - 1.24 mg/dL   Calcium 8.0 (L) 8.9 - 10.3 mg/dL   GFR calc non Af Amer >60 >60 mL/min   GFR calc Af Amer >60 >60 mL/min    Comment: (NOTE) The eGFR has been calculated using the CKD EPI equation. This calculation has not been validated in all clinical situations. eGFR's persistently <60 mL/min signify possible Chronic Kidney Disease.    Anion gap 9 5 - 15    Dg Chest Portable 1 View  Result Date: 03/03/2017 CLINICAL DATA:  Extremity weakness 3 weeks getting worse. Difficulty breathing intermittently. EXAM: PORTABLE CHEST 1 VIEW COMPARISON:  08/09/2012 and 08/06/2012 FINDINGS: Sternotomy wires unchanged. Left-sided pacemaker unchanged. Lungs are hypoinflated without lobar consolidation or effusion. Minimal prominence of the perihilar markings without significant change likely mild chronic vascular congestion. Cardiomediastinal silhouette is within normal. There is calcified plaque over the aortic arch. There are  degenerative changes of the spine. IMPRESSION: Hypoinflated lungs with suggestion of minimal chronic vascular congestion. Electronically Signed  By: Marin Olp M.D.   On: 03/03/2017 19:16    Review of Systems  Constitutional: Positive for malaise/fatigue.  HENT: Negative.   Eyes: Negative.   Respiratory: Positive for shortness of breath.   Cardiovascular: Positive for orthopnea, leg swelling and PND.  Gastrointestinal: Negative.   Genitourinary: Negative.   Musculoskeletal: Positive for falls and myalgias.  Skin: Negative.   Neurological: Positive for dizziness and weakness.  Endo/Heme/Allergies: Negative.   Psychiatric/Behavioral: Negative.    Blood pressure (!) 120/55, pulse 66, temperature 97.7 F (36.5 C), temperature source Oral, resp. rate 18, height 5' 5"  (1.651 m), weight 182 lb (82.6 kg), SpO2 95 %. Physical Exam  Nursing note and vitals reviewed. Constitutional: He is oriented to person, place, and time. He appears well-developed and well-nourished.  HENT:  Head: Normocephalic and atraumatic.  Eyes: Pupils are equal, round, and reactive to light. Conjunctivae and EOM are normal.  Neck: Normal range of motion. Neck supple.  Cardiovascular: Normal rate and regular rhythm.   Murmur heard. Respiratory: Effort normal and breath sounds normal.  GI: Soft. Bowel sounds are normal.  Musculoskeletal: He exhibits edema.  Neurological: He is alert and oriented to person, place, and time. He has normal reflexes. He exhibits abnormal muscle tone. He displays a negative Romberg sign.  Generalized weakness  Skin: Skin is warm and dry. There is erythema.  Psychiatric: He has a normal mood and affect.    Assessment/Plan: Congestive heart failure Generalized weakness Elevated BNP Chronic renal insufficiency Hyperlipidemia Atrial fibrillation Shortness of breath . Plan Agree with admit to telemetry Follow-up EKGs and troponins IV Lasix therapy for congestion and heart  failure Physical therapy for generalized weakness Consider discontinuing Mevacor because of generalized weakness possibly related to statin For leg edema recommend elevation compression IV diuretics Consider nephrology evaluation for renal insufficiency Consider DVT prophylaxis Agree with Imdur therapy for coronary disease and angina Coumadin for anticoagulation for A. fib  Brailyn Killion D Kyle Rollins 03/04/2017, 12:36 PM

## 2017-03-05 LAB — PROTIME-INR
INR: 1.28
Prothrombin Time: 15.9 seconds — ABNORMAL HIGH (ref 11.4–15.2)

## 2017-03-05 MED ORDER — WARFARIN SODIUM 4 MG PO TABS
4.0000 mg | ORAL_TABLET | Freq: Once | ORAL | Status: AC
  Administered 2017-03-05: 4 mg via ORAL
  Filled 2017-03-05: qty 1

## 2017-03-05 MED ORDER — WARFARIN SODIUM 3 MG PO TABS: 3.0000 mg | ORAL_TABLET | Freq: Once | ORAL | Status: DC

## 2017-03-05 NOTE — Progress Notes (Signed)
[facility] - [place] [facility]   PATIENT NAME: Kyle Rollins    MR#:  [id number]  DATE OF BIRTH:  Nov 17, 1927  SUBJECTIVE:   Came in with weakness and increasing sob Patient feels a lot better today REVIEW OF SYSTEMS:   Review of Systems  Constitutional: Negative for chills, fever and weight loss.  HENT: Negative for ear discharge, ear pain and nosebleeds.   Eyes: Negative for blurred vision, pain and discharge.  Respiratory: Positive for shortness of breath. Negative for sputum production, wheezing and stridor.   Cardiovascular: Positive for leg swelling. Negative for chest pain, palpitations, orthopnea and PND.  Gastrointestinal: Negative for abdominal pain, diarrhea, nausea and vomiting.  Genitourinary: Negative for frequency and urgency.  Musculoskeletal: Negative for back pain and joint pain.  Neurological: Positive for weakness. Negative for sensory change, speech change and focal weakness.  Psychiatric/Behavioral: Negative for depression and hallucinations. The patient is not nervous/anxious.    Tolerating Diet: Yes Tolerating PT: Pending  DRUG ALLERGIES:  No Known Allergies  VITALS:  Blood pressure 124/68, pulse 72, temperature (!) 100.5 F (38.1 C), temperature source Oral, resp. rate 18, height 5\' 5"  (1.651 m), weight 82.6 kg (182 lb), SpO2 96 %.  PHYSICAL EXAMINATION:   Physical Exam  GENERAL:  83 y.o.-year-old patient lying in the bed with no acute distress.  EYES: Pupils equal, round, reactive to light and accommodation. No scleral icterus. Extraocular muscles intact.  HEENT: Head atraumatic, normocephalic. Oropharynx and nasopharynx clear.  NECK:  Supple, no jugular venous distention. No thyroid enlargement, no tenderness.  LUNGS: decreasedl breath sounds bilaterally, no wheezing, rales, rhonchi. No use of accessory muscles of respiration.  CARDIOVASCULAR: S1, S2 normal. No murmurs, rubs, or gallops.  ABDOMEN: Soft, nontender,  nondistended. Bowel sounds present. No organomegaly or mass. Scrotal edema EXTREMITIES: No cyanosis, clubbing or edema b/l.    NEUROLOGIC: Cranial nerves II through XII are intact. No focal Motor or sensory deficits b/l.   PSYCHIATRIC:  patient is alert and oriented x 3.  SKIN: No obvious rash, lesion, or ulcer.   LABORATORY PANEL:  CBC  Recent Labs Lab 03/03/17 1848  WBC 5.4  HGB 12.0*  HCT 35.9*  PLT 104*    Chemistries   Recent Labs Lab 03/03/17 1848 03/04/17 0634  NA 135 137  K 3.6 3.5  CL 101 104  CO2 25 24  GLUCOSE 138* 93  BUN 30* 29*  CREATININE 1.32* 1.07  CALCIUM 8.2* 8.0*  AST 49*  --   ALT 17  --   ALKPHOS 60  --   BILITOT 0.8  --    Cardiac Enzymes  Recent Labs Lab 03/03/17 1848  TROPONINI <0.03   RADIOLOGY:  Dg Chest Portable 1 View  Result Date: 03/03/2017 CLINICAL DATA:  Extremity weakness 3 weeks getting worse. Difficulty breathing intermittently. EXAM: PORTABLE CHEST 1 VIEW COMPARISON:  08/09/2012 and 08/06/2012 FINDINGS: Sternotomy wires unchanged. Left-sided pacemaker unchanged. Lungs are hypoinflated without lobar consolidation or effusion. Minimal prominence of the perihilar markings without significant change likely mild chronic vascular congestion. Cardiomediastinal silhouette is within normal. There is calcified plaque over the aortic arch. There are degenerative changes of the spine. IMPRESSION: Hypoinflated lungs with suggestion of minimal chronic vascular congestion. Electronically Signed   By: Elberta Fortisaniel  Boyle M.D.   On: 03/03/2017 19:16   ASSESSMENT AND PLAN:  83 year old male admitted for acute kidney injury.  1. AKI: with volume overload patient needs diuresis as well as improve cardiac output. We will  avoid nephrotoxic agents.  -cont lasix for now -creatinine better  2. CHF: Acute on chronic; systolic. The patient already has a biventricular pacemaker and ischemic cardiomyopathy. Continue IV Lasix. Consult cardiology with Dr  Juliann Pares noted  3. CAD: Stable; continue Isordil  4. A. fib: Rate controlled; continue warfarin.  5. Hyperlipidemia: Continue statin therapy  6. DVT prophylaxis: As above on coumadin  PT to see pt D/w wife Case discussed with Care Management/Social Worker. Management plans discussed with the patient, family and they are in agreement.  CODE STATUS: full  DVT Prophylaxis: coumadin  TOTAL TIME TAKING CARE OF THIS PATIENT: 25 minutes.  >50% time spent on counselling and coordination of care  POSSIBLE D/C IN *1-2* DAYS, DEPENDING ON CLINICAL CONDITION.  Note: This dictation was prepared with Dragon dictation along with smaller phrase technology. Any transcriptional errors that result from this process are unintentional.  Belma Dyches M.D on 03/05/2017 at 12:50 PM  Between 7am to 6pm - Pager - [phone]  After 6pm go to [email or web address] - Social research officer, government  [facility]  Office  [phone]  CC: Primary care physician; Kandyce Rud, MD

## 2017-03-05 NOTE — Evaluation (Signed)
Physical Therapy Evaluation Patient Details Name: Kyle AMBROSIUS Sr. MRN: [id number] DOB: 06/09/1927 Today's Date: 03/05/2017   History of Present Illness  presented to ER secondary to progressive weakness (x2 days), unable to stand; admitted with AKI, Afib.  Clinical Impression  Upon evaluation, patient alert and oriented; follows commands and demonstrates good effort with all functional activities.  Bilat LEs generally weak and deconditioned (3-/5); significant pitting edema throughout abdomen and LEs.  Currently requiring min assist for bed mobility; cga/min assist for sit/stand, basic transfers and gait (60') with RW.  Slow and guarded, but no episodes of buckling or LOB noted during mobility efforts. Do recommend continued use of RW for all mobility efforts at this time. Did provide education about safety/technique with functional transfers, walker management and home safety modification; patient/family voiced understanding of all information provided. Would benefit from skilled PT to address above deficits and promote optimal return to PLOF; Recommend transition to HHPT upon discharge from acute hospitalization.     Follow Up Recommendations Home health PT    Equipment Recommendations   (has RW, BSC in home)    Recommendations for Other Services       Precautions / Restrictions Precautions Precautions: Fall;ICD/Pacemaker Restrictions Weight Bearing Restrictions: No      Mobility  Bed Mobility Overal bed mobility: Needs Assistance Bed Mobility: Supine to Sit     Supine to sit: Min assist        Transfers Overall transfer level: Needs assistance Equipment used: Rolling walker (2 wheeled) Transfers: Sit to/from Stand Sit to Stand: Min guard;Min assist         General transfer comment: cuing for hand placement to prevent pulling on RW  Ambulation/Gait Ambulation/Gait assistance: Min assist Ambulation Distance (Feet): 60 Feet Assistive device: Rolling walker (2  wheeled)       General Gait Details: broad BOS, short, shuffling steps; forward flexed posture, min cuing for walker position (tends to maintain arms length anterior to patient).  Mod SOB with exertion, but sats >90% on RA.  Stairs            Wheelchair Mobility    Modified Rankin (Stroke Patients Only)       Balance Overall balance assessment: Needs assistance Sitting-balance support: No upper extremity supported;Feet supported Sitting balance-Leahy Scale: Fair     Standing balance support: Bilateral upper extremity supported Standing balance-Leahy Scale: Fair                               Pertinent Vitals/Pain Pain Assessment: No/denies pain    Home Living Family/patient expects to be discharged to:: Private residence Living Arrangements: Spouse/significant other Available Help at Discharge: Family;Available PRN/intermittently Type of Home: House Home Access: Stairs to enter Entrance Stairs-Rails: Left Entrance Stairs-Number of Steps: 3-4 Home Layout: One level Home Equipment: Walker - 2 wheels;Cane - quad;Bedside commode      Prior Function Level of Independence: Independent with assistive device(s)         Comments: Mod indep for ADLs, household and limited community distance with QC; + driving; endorses 1-2 falls within previous six months.     Hand Dominance   Dominant Hand: Right    Extremity/Trunk Assessment   Upper Extremity Assessment Upper Extremity Assessment: Overall WFL for tasks assessed    Lower Extremity Assessment Lower Extremity Assessment: Generalized weakness (grossly 3-/5 throughout; generalized, pitting edema throughout abdomen, bilat LEs)       Communication  Communication: No difficulties  Cognition Arousal/Alertness: Awake/alert Behavior During Therapy: WFL for tasks assessed/performed Overall Cognitive Status: Within Functional Limits for tasks assessed                                         General Comments      Exercises Other Exercises Other Exercises: Sit/stand from variety of surfaces: bed, BSC with RW, cga/min assist for safety.  Cuing for hand placement.  Discussed use of BSC frame over standard toilet to elevate height, provide UE support; patient/family voiced understanding. Other Exercises: Alternate LE forward/backward stepping with RW, cga/min assist-fair LE stability without buckling or overt LOB. Other Exercises: Static standing balance for hygiene, clothing management after incontinent bowel episode, cga/min assist.     Assessment/Plan    PT Assessment Patient needs continued PT services  PT Problem List Decreased strength;Decreased range of motion;Decreased activity tolerance;Decreased balance;Decreased mobility;Decreased knowledge of use of DME;Decreased safety awareness;Decreased knowledge of precautions;Cardiopulmonary status limiting activity;Decreased skin integrity       PT Treatment Interventions DME instruction;Gait training;Stair training;Functional mobility training;Therapeutic activities;Therapeutic exercise;Balance training;Patient/family education    PT Goals (Current goals can be found in the Care Plan section)  Acute Rehab PT Goals Patient Stated Goal: to get my legs stronger PT Goal Formulation: With patient Time For Goal Achievement: 03/19/17 Potential to Achieve Goals: Good    Frequency Min 2X/week   Barriers to discharge Decreased caregiver support      Co-evaluation               AM-PAC PT "6 Clicks" Daily Activity  Outcome Measure Difficulty turning over in bed (including adjusting bedclothes, sheets and blankets)?: Unable Difficulty moving from lying on back to sitting on the side of the bed? : Unable Difficulty sitting down on and standing up from a chair with arms (e.g., wheelchair, bedside commode, etc,.)?: Unable Help needed moving to and from a bed to chair (including a wheelchair)?: A Little Help needed  walking in hospital room?: A Little Help needed climbing 3-5 steps with a railing? : A Lot 6 Click Score: 11    End of Session Equipment Utilized During Treatment: Gait belt Activity Tolerance: Patient tolerated treatment well Patient left: in bed;with call bell/phone within reach;with bed alarm set Nurse Communication: Mobility status PT Visit Diagnosis: Difficulty in walking, not elsewhere classified (R26.2);Muscle weakness (generalized) (M62.81)    Time: 9562-13081412-1442 PT Time Calculation (min) (ACUTE ONLY): 30 min   Charges:   PT Evaluation $PT Eval Low Complexity: 1 Low PT Treatments $Therapeutic Activity: 23-37 mins   PT G Codes:   PT G-Codes **NOT FOR INPATIENT CLASS** Functional Assessment Tool Used: AM-PAC 6 Clicks Basic Mobility Functional Limitation: Mobility: Walking and moving around Mobility: Walking and Moving Around Current Status (M5784(G8978): At least 40 percent but less than 60 percent impaired, limited or restricted Mobility: Walking and Moving Around Goal Status [phone](G8979): At least 1 percent but less than 20 percent impaired, limited or restricted    Elleah Hemsley H. Manson PasseyBrown, PT, DPT, NCS 03/05/17, 3:30 PM [phone]

## 2017-03-05 NOTE — Progress Notes (Signed)
ANTICOAGULATION CONSULT NOTE - Initial Consult  Pharmacy Consult for warfarin Indication: atrial fibrillation  No Known Allergies  Patient Measurements: Height: 5\' 5"  (165.1 cm) Weight: 182 lb (82.6 kg) IBW/kg (Calculated) : 61.5  Vital Signs: Temp: 100.5 F (38.1 C) (10/28 0815) Temp Source: Oral (10/28 0815) BP: 124/68 (10/28 0815) Pulse Rate: 72 (10/28 0815)  Labs:  Recent Labs  03/03/17 1848 03/04/17 0634 03/05/17 0622  HGB 12.0*  --   --   HCT 35.9*  --   --   PLT 104*  --   --   LABPROT 16.0* 16.8* 15.9*  INR 1.29 1.37 1.28  CREATININE 1.32* 1.07  --   TROPONINI <0.03  --   --     Estimated Creatinine Clearance: 48.1 mL/min (by C-G formula based on SCr of 1.07 mg/dL).   Medical History: Past Medical History:  Diagnosis Date  . CHF (congestive heart failure) (HCC)   . Hyperlipidemia     Assessment: Pharmacy consulted to dose and monitor warfarin in this patient who was taking prior to admission for afib. INR subtherapeutic on admission.  Home regimen: Warfarin 2 mg TThFSSun Warfarin 1 mg MW  Goal of Therapy:  INR 2-3 Monitor platelets by anticoagulation protocol: Yes   Plan:  Given subtherapeutic INR, will give warfarin 4 mg dose tonight.  Patient is currently on heparin 5000 units q8h subQ. Can continue while INR is subtherapeutic per MD. Will recheck INR with AM labs tomorrow.  Cindi CarbonMary M Natalyah Cummiskey, PharmD, BCPS Clinical Pharmacist 03/05/2017

## 2017-03-06 LAB — PROTIME-INR
INR: 1.4
Prothrombin Time: 17 seconds — ABNORMAL HIGH (ref 11.4–15.2)

## 2017-03-06 MED ORDER — WARFARIN SODIUM 2 MG PO TABS
2.0000 mg | ORAL_TABLET | ORAL | Status: DC
  Administered 2017-03-06: 2 mg via ORAL

## 2017-03-06 MED ORDER — WARFARIN SODIUM 1 MG PO TABS
1.0000 mg | ORAL_TABLET | ORAL | Status: DC
  Filled 2017-03-06: qty 1

## 2017-03-06 MED ORDER — FUROSEMIDE 20 MG PO TABS: 40.0000 mg | ORAL_TABLET | Freq: Every day | ORAL | 1 refills | Status: DC

## 2017-03-06 MED ORDER — FUROSEMIDE 40 MG PO TABS: 40.0000 mg | ORAL_TABLET | Freq: Every day | ORAL | Status: DC

## 2017-03-06 NOTE — Progress Notes (Signed)
Pt to be discharged this afternoon. Iv and tele removed. disch instructions given to wife. Pt assisted to dress. Awaiting transport by daughter.

## 2017-03-06 NOTE — Care Management Important Message (Signed)
Important Message  Patient Details  Name: Kyle Balesrvin C Grajales Sr. MRN: [id number] Date of Birth: May 03, 1928   Medicare Important Message Given:  Yes Signed IM notice given    Eber HongGreene, Denman Pichardo R, RN 03/06/2017, 1:28 PM

## 2017-03-06 NOTE — Progress Notes (Signed)
ANTICOAGULATION CONSULT NOTE - Initial Consult  Pharmacy Consult for warfarin Indication: atrial fibrillation  No Known Allergies  Patient Measurements: Height: 5\' 5"  (165.1 cm) Weight: 181 lb 14.4 oz (82.5 kg) IBW/kg (Calculated) : 61.5  Vital Signs: Temp: 98.4 F (36.9 C) (10/29 0817) Temp Source: Oral (10/29 0817) BP: 111/54 (10/29 0817) Pulse Rate: 61 (10/29 0817)  Labs:  Recent Labs  03/03/17 1848 03/04/17 0634 03/05/17 0622 03/06/17 0545  HGB 12.0*  --   --   --   HCT 35.9*  --   --   --   PLT 104*  --   --   --   LABPROT 16.0* 16.8* 15.9* 17.0*  INR 1.29 1.37 1.28 1.40  CREATININE 1.32* 1.07  --   --   TROPONINI <0.03  --   --   --     Estimated Creatinine Clearance: 48.1 mL/min (by C-G formula based on SCr of 1.07 mg/dL).   Medical History: Past Medical History:  Diagnosis Date  . CHF (congestive heart failure) (HCC)   . Hyperlipidemia     Assessment: Pharmacy consulted to dose and monitor warfarin in this patient who was taking prior to admission for afib. INR subtherapeutic on admission.  Home regimen: Warfarin 2 mg TThFSSun Warfarin 1 mg MW  Goal of Therapy:  INR 2-3 Monitor platelets by anticoagulation protocol: Yes   Plan:  INR is subtherapeutic. Most likely bc wife was holding warfarin due to bursing concerns. Reviewed with wife and patient the importance of taking medications and pt risk for stroke. Explained that bruising and longer clotting time was normal and when to report s/sx of bleeding to dr. Denton LankWife reports that his last dose before coming into the hospital was probably 10/22. Pt received 4mg  last night. I will resume home dose tonight.   Olene FlossMelissa D Maccia, PharmD, BCPS Clinical Pharmacist 03/06/2017

## 2017-03-06 NOTE — Evaluation (Signed)
Occupational Therapy Evaluation Patient Details Name: Kyle PALARDY Sr. MRN: [id number] DOB: 1927/05/22 Today's Date: 03/06/2017    History of Present Illness 83yo male pt presented to ER secondary to progressive weakness (x2 days), unable to stand; admitted with AKI, Afib.   Clinical Impression   Pt is 83 year old male who presents to [facility] hospital secondary to generalized progressive weakness. Pt reports over past couple weeks has progressively weakened which impaired his already limited mobility and required him to modify how he was bathing at home (taking standing sponge bath at sink because he no longer was able to perform tub transfers safely). Pt endorses 1-2 falls in past 12 months but unsure as to cause. Pt currently requires moderate assist for bed mobility, minimal to moderate assist for LB ADLs, and set up and supervision for seated grooming tasks due to decreased strength and decreased endurance for functional tasks, with an increased risk for falls. Lying down with HOB elevated O2 sats on RA 94-96%, HR 64; sitting EOB O2 sats on RA 90-92%, HR up to 80 briefly then came back down to mid-60's; back in bed with HOB elevated, O2 sats 91-92%, HR 67. No dizziness, nausea, SOB noted by pt. Pt/family educated in energy conservation techniques, AE/DME for self care tasks, pursed lip breathing and recommendations for home/routine modifications.  Pt would benefit from skilled OT services to maximize functional independence in ADLs and minimize risk of future falls/injury/readmission/increased caregiver burden/functional decline. Pt and family very eager to return home. Educated pt/family in barriers to return home and benefits of STR based on pt's functional status. Pt reports not sleeping well the previous night which he says has contributed to his fatigue this date. Pt may maximally benefit from STR should he not progress as anticipated over the next day or two.     Follow Up Recommendations   Home health OT ( (may require STR should he not progress as anticipated))   Equipment Recommendations  Toilet rise with handles    Recommendations for Other Services       Precautions / Restrictions Precautions Precautions: Fall;ICD/Pacemaker Restrictions Weight Bearing Restrictions: No      Mobility Bed Mobility Overal bed mobility: Needs Assistance Bed Mobility: Supine to Sit;Sit to Supine     Supine to sit: Mod assist;HOB elevated Sit to supine: Mod assist   General bed mobility comments: cues for BUE use, increased effort/time, mod assist for BLE mgt and trunk support   Transfers Overall transfer level: Needs assistance Equipment used: Rolling walker (2 wheeled) Transfers: Sit to/from Stand;Lateral/Scoot Transfers Sit to Stand: Min assist        Lateral/Scoot Transfers: Min assist General transfer comment: cues for hand placement to push up from bed    Balance Overall balance assessment: Needs assistance Sitting-balance support: Single extremity supported;Bilateral upper extremity supported;No upper extremity supported;Feet supported Sitting balance-Leahy Scale: Fair Sitting balance - Comments: pt able to sit EOB for grooming tasks with no UE support for brief amount of time before requiring UE or BUE support due to fatigue/poor activity tolerance/decreased strength   Standing balance support: Bilateral upper extremity supported Standing balance-Leahy Scale: Fair                             ADL either performed or assessed with clinical judgement   ADL Overall ADL's : Needs assistance/impaired Eating/Feeding: Set up;Sitting   Grooming: Sitting;Set up;Wash/dry face;Oral care;Supervision/safety Grooming Details (indicate cue type and reason):  EOB grooming with supervision for safety due to fatigu/sitting balance Upper Body Bathing: Sitting;Set up;Min guard   Lower Body Bathing: Sitting/lateral leans;Sit to/from stand;Minimal assistance;Moderate  assistance Lower Body Bathing Details (indicate cue type and reason): Pt/family educated in seated bathing and AE/DME for LB bathing tasks to improve safety and functional independence while conserving energy. Pt/family verbalized understanding. Upper Body Dressing : Sitting;Set up;Supervision/safety   Lower Body Dressing: Sitting/lateral leans;Sit to/from stand;Minimal assistance;Moderate assistance Lower Body Dressing Details (indicate cue type and reason): Pt/family educated in optimal positioning and AE for LB dressing tasks to improve safety and functional independence while conserving energy. Pt/family verbalized understanding. Toilet Transfer: RW;BSC;Stand-pivot;Min Sports administratorguard;Minimal assistance Toilet Transfer Details (indicate cue type and reason): Pt/family educated in use of BSC and urinal for day and nighttime toileting needs to improve safety and functional independence while conserving energy. Pt/family verbalized understanding.         Functional mobility during ADLs: Min guard;Minimal assistance;Rolling walker       Vision Baseline Vision/History: Wears glasses Wears Glasses: Reading only Patient Visual Report: No change from baseline Vision Assessment?: No apparent visual deficits     Perception     Praxis      Pertinent Vitals/Pain Pain Assessment: No/denies pain     Hand Dominance Right   Extremity/Trunk Assessment Upper Extremity Assessment Upper Extremity Assessment: Generalized weakness (3+/5 bilaterally)   Lower Extremity Assessment Lower Extremity Assessment: Generalized weakness (grossly 3-/5 throughout bilaterally, pitting edema in abdomen and BLE)   Cervical / Trunk Assessment Cervical / Trunk Assessment: Normal   Communication Communication Communication: No difficulties   Cognition Arousal/Alertness: Awake/alert Behavior During Therapy: WFL for tasks assessed/performed Overall Cognitive Status: Within Functional Limits for tasks assessed                                  General Comments: initially fatigued, alertness improved with activity   General Comments  Lying down with HOB elevated O2 sats on RA 94-96%, HR 64; sitting EOB O2 sats on RA 90-92%, HR up to 80 briefly then came back down to mid-60's; back in bed with HOB elevated, O2 sats 91-92%, HR 67. No dizziness, nausea, SOB noted by pt.    Exercises Other Exercises Other Exercises: Pt/family educated in energy conservation strategies including DME/AE for self care tasks, home/routines modifications and positioning, work simplification, falls prevention strategies. Pt/family verbalized understanding. Other Exercises: Reinforced education/benefit of using a RW versus the QC to improve stability and safety for mobility. Pt/family verbalized understanding. Other Exercises: Pt/family educated in benefits/proper use of power lift recliner in order to maximize safety and prevent functional decline for transfers by over relying on lift. Pt/family verbalized understanding.   Shoulder Instructions      Home Living Family/patient expects to be discharged to:: Private residence Living Arrangements: Spouse/significant other;Children (65yo son who helps pt/spouse) Available Help at Discharge: Family;Available PRN/intermittently Type of Home: House Home Access: Stairs to enter Entergy CorporationEntrance Stairs-Number of Steps: 3-4 Entrance Stairs-Rails: Left Home Layout: One level     Bathroom Shower/Tub: Chief Strategy OfficerTub/shower unit   Bathroom Toilet: Standard     Home Equipment: Environmental consultantWalker - 2 wheels;Cane - quad;Bedside commode;Shower seat;Grab bars - toilet;Grab bars - tub/shower;Hand held shower head;Adaptive equipment;Other (comment) (power lift recliner) Adaptive Equipment: Reacher        Prior Functioning/Environment Level of Independence: Independent with assistive device(s);Needs assistance  Gait / Transfers Assistance Needed: household and limited community distance with QC; +  driving;   ADL's / Homemaking Assistance Needed: mod indep with basic ADL up until a couple weeks ago, transitioned to sponge bath standing at sink 2:2 weakness and unable to get in/out of tub; pt drives; spouse does cooking/cleaning   Comments: endorses 1-2 falls within previous six months, unclear on cause of falls        OT Problem List: Decreased strength;Decreased activity tolerance;Decreased safety awareness;Decreased knowledge of use of DME or AE;Impaired balance (sitting and/or standing)      OT Treatment/Interventions: Self-care/ADL training;Therapeutic activities;Energy conservation;DME and/or AE instruction;Patient/family education    OT Goals(Current goals can be found in the care plan section) Acute Rehab OT Goals Patient Stated Goal: get stronger and go home OT Goal Formulation: With patient/family Time For Goal Achievement: 03/20/17 Potential to Achieve Goals: Good  OT Frequency: Min 2X/week   Barriers to D/C:            Co-evaluation              AM-PAC PT "6 Clicks" Daily Activity     Outcome Measure Help from another person eating meals?: None Help from another person taking care of personal grooming?: None Help from another person toileting, which includes using toliet, bedpan, or urinal?: A Little Help from another person bathing (including washing, rinsing, drying)?: A Lot Help from another person to put on and taking off regular upper body clothing?: A Little Help from another person to put on and taking off regular lower body clothing?: A Lot 6 Click Score: 18   End of Session    Activity Tolerance: Patient tolerated treatment well;Patient limited by fatigue Patient left: in bed;with call bell/phone within reach;with bed alarm set;with family/visitor present  OT Visit Diagnosis: Other abnormalities of gait and mobility (R26.89);Muscle weakness (generalized) (M62.81);History of falling (Z91.81)                Time: 9528-4132 OT Time Calculation (min): 59  min Charges:  OT General Charges $OT Visit: 1 Visit OT Evaluation $OT Eval Low Complexity: 1 Low OT Treatments $Self Care/Home Management : 38-52 mins G-Codes: OT G-codes **NOT FOR INPATIENT CLASS** Functional Assessment Tool Used: AM-PAC 6 Clicks Daily Activity;Clinical judgement Functional Limitation: Self care Self Care Current Status (G4010): At least 40 percent but less than 60 percent impaired, limited or restricted Self Care Goal Status (U7253): At least 1 percent but less than 20 percent impaired, limited or restricted   Richrd Prime, MPH, MS, OTR/L ascom [phone] 03/06/17, 11:37 AM

## 2017-03-06 NOTE — Discharge Instructions (Addendum)
Information on my medicine - Coumadin®   (Warfarin) ° °This medication education was reviewed with me or my healthcare representative as part of my discharge preparation.  The pharmacist that spoke with me during my hospital stay was:  Melissa D Maccia, RPH ° °Why was Coumadin prescribed for you? °Coumadin was prescribed for you because you have a blood clot or a medical condition that can cause an increased risk of forming blood clots. Blood clots can cause serious health problems by blocking the flow of blood to the heart, lung, or brain. Coumadin can prevent harmful blood clots from forming. °As a reminder your indication for Coumadin is:   Stroke Prevention Because Of Atrial Fibrillation ° °What test will check on my response to Coumadin? °While on Coumadin (warfarin) you will need to have an INR test regularly to ensure that your dose is keeping you in the desired range. The INR (international normalized ratio) number is calculated from the result of the laboratory test called prothrombin time (PT). ° °If an INR APPOINTMENT HAS NOT ALREADY BEEN MADE FOR YOU please schedule an appointment to have this lab work done by your health care provider within 7 days. °Your INR goal is usually a number between:  2 to 3 or your provider may give you a more narrow range like 2-2.5.  Ask your health care provider during an office visit what your goal INR is. ° °What  do you need to  know  About  COUMADIN? °Take Coumadin (warfarin) exactly as prescribed by your healthcare provider about the same time each day.  DO NOT stop taking without talking to the doctor who prescribed the medication.  Stopping without other blood clot prevention medication to take the place of Coumadin may increase your risk of developing a new clot or stroke.  Get refills before you run out. ° °What do you do if you miss a dose? °If you miss a dose, take it as soon as you remember on the same day then continue your regularly scheduled regimen the next  day.  Do not take two doses of Coumadin at the same time. ° °Important Safety Information °A possible side effect of Coumadin (Warfarin) is an increased risk of bleeding. You should call your healthcare provider right away if you experience any of the following: °? Bleeding from an injury or your nose that does not stop. °? Unusual colored urine (red or dark brown) or unusual colored stools (red or black). °? Unusual bruising for unknown reasons. °? A serious fall or if you hit your head (even if there is no bleeding). ° °Some foods or medicines interact with Coumadin® (warfarin) and might alter your response to warfarin. To help avoid this: °? Eat a balanced diet, maintaining a consistent amount of Vitamin K. °? Notify your provider about major diet changes you plan to make. °? Avoid alcohol or limit your intake to 1 drink for women and 2 drinks for men per day. °(1 drink is 5 oz. wine, 12 oz. beer, or 1.5 oz. liquor.) ° °Make sure that ANY health care provider who prescribes medication for you knows that you are taking Coumadin (warfarin).  Also make sure the healthcare provider who is monitoring your Coumadin knows when you have started a new medication including herbals and non-prescription products. ° °Coumadin® (Warfarin)  Major Drug Interactions  °Increased Warfarin Effect Decreased Warfarin Effect  °Alcohol (large quantities) °Antibiotics (esp. Septra/Bactrim, Flagyl, Cipro) °Amiodarone (Cordarone) °Aspirin (ASA) °Cimetidine (Tagamet) °Megestrol (Megace) °NSAIDs (ibuprofen,   naproxen, etc.) Piroxicam (Feldene) Propafenone (Rythmol SR) Propranolol (Inderal) Isoniazid (INH) Posaconazole (Noxafil) Barbiturates (Phenobarbital) Carbamazepine (Tegretol) Chlordiazepoxide (Librium) Cholestyramine (Questran) Griseofulvin Oral Contraceptives Rifampin Sucralfate (Carafate) Vitamin K   Coumadin (Warfarin) Major Herbal Interactions  Increased Warfarin Effect Decreased Warfarin Effect   Garlic Ginseng Ginkgo biloba Coenzyme Q10 Green tea St. Johns wort    Coumadin (Warfarin) FOOD Interactions  Eat a consistent number of servings per week of foods HIGH in Vitamin K (1 serving =  cup)  Collards (cooked, or boiled & drained) Kale (cooked, or boiled & drained) Mustard greens (cooked, or boiled & drained) Parsley *serving size only =  cup Spinach (cooked, or boiled & drained) Swiss chard (cooked, or boiled & drained) Turnip greens (cooked, or boiled & drained)  Eat a consistent number of servings per week of foods MEDIUM-HIGH in Vitamin K (1 serving = 1 cup)  Asparagus (cooked, or boiled & drained) Broccoli (cooked, boiled & drained, or raw & chopped) Brussel sprouts (cooked, or boiled & drained) *serving size only =  cup Lettuce, raw (green leaf, endive, romaine) Spinach, raw Turnip greens, raw & chopped    Keep your [facility] for [facility] for November 1

## 2017-03-06 NOTE — Care Management (Signed)
Patient for discharge home today.  Does not require home oxygen.  Has access to scales for daily weights.  Appointment has been made to [facility] Failure [facility].  Agreeable to Home Health and no agency preference.  Referral called to and accepted by [facility] for SN PT and OT. Obtained order for [facility] Lasix Protocol. Confirmed all contact information. Paraschos is follow up cardiologist.  Patient has a walker.

## 2017-03-06 NOTE — Discharge Summary (Signed)
[facility] [place] [facility]   PATIENT NAME: Kyle Rollins    MR#:  [id number]  DATE OF BIRTH:  1928/03/11  DATE OF ADMISSION:  03/03/2017 ADMITTING PHYSICIAN: Arnaldo Natal, MD  DATE OF DISCHARGE: 03/06/2017  PRIMARY CARE PHYSICIAN: Kandyce Rud, MD    ADMISSION DIAGNOSIS:  Weakness [R53.1] Acute on chronic combined systolic and diastolic congestive heart failure (HCC) [I50.43] AKI (acute kidney injury) (HCC) [N17.9]  DISCHARGE DIAGNOSIS:   Acute on chronic combined systolic diastolic congestive heart failure Chronic lower extremity edema SECONDARY DIAGNOSIS:   Past Medical History:  Diagnosis Date  . CHF (congestive heart failure) (HCC)   . Hyperlipidemia     HOSPITAL COURSE:   83 year old male admitted for acute kidney injury.  1. AKI: with volume overload patient needs diuresis as well as improve cardiac output. We will avoid nephrotoxic agents.  -cont lasix for now -creatinine better  2. CHF: Acute on chronic; systolic/diastolic - patient already has a biventricular pacemaker and ischemic cardiomyopathy. - Continue IV Lasix 20 mg 3 times daily--patient seem to diurese well.  His weight is at baseline of 180 pounds - consult cardiology with Dr Juliann Pares noted -Change to oral Lasix 40 mg daily -We will arrange for home health RN for CHF education  3. CAD: Stable; continue Isordil  4. A. fib: Rate controlled; continue warfarin. -Patient has appointment for INR check on Thursday, November 1  5. Hyperlipidemia: Continue statin therapy  6. DVT prophylaxis: As above on coumadin  PT recommends home health PT and OT which will be set up  Discharge plan was discussed with patient and wife CONSULTS OBTAINED:  Treatment Team:  Alwyn Pea, MD  DRUG ALLERGIES:  No Known Allergies  DISCHARGE MEDICATIONS:   Current Discharge Medication List    CONTINUE these medications which have CHANGED   Details   furosemide (LASIX) 20 MG tablet Take 2 tablets (40 mg total) by mouth daily. Qty: 30 tablet, Refills: 1      CONTINUE these medications which have NOT CHANGED   Details  bacitracin ointment Apply 1 application topically 3 (three) times daily.    isosorbide dinitrate (ISORDIL) 30 MG tablet Take 1 tablet by mouth 3 (three) times daily.    lovastatin (MEVACOR) 40 MG tablet Take 2 tablets by mouth daily.     metoprolol tartrate (LOPRESSOR) 25 MG tablet Take 1 tablet by mouth 2 (two) times daily.    nitroGLYCERIN (NITROSTAT) 0.4 MG SL tablet Place 1 tablet under the tongue. Every 5 minutes as needed for chest pain. May take up to 3 doses.    warfarin (COUMADIN) 2 MG tablet Take 1 tablet by mouth daily. Break in half on Monday and Wednesday        If you experience worsening of your admission symptoms, develop shortness of breath, life threatening emergency, suicidal or homicidal thoughts you must seek medical attention immediately by calling 911 or calling your MD immediately  if symptoms less severe.  You Must read complete instructions/literature along with all the possible adverse reactions/side effects for all the Medicines you take and that have been prescribed to you. Take any new Medicines after you have completely understood and accept all the possible adverse reactions/side effects.   Please note  You were cared for by a hospitalist during your hospital stay. If you have any questions about your discharge medications or the care you received while you were in the hospital after you are discharged, you can call the  unit and asked to speak with the hospitalist on call if the hospitalist that took care of you is not available. Once you are discharged, your primary care physician will handle any further medical issues. Please note that NO REFILLS for any discharge medications will be authorized once you are discharged, as it is imperative that you return to your primary care physician  (or establish a relationship with a primary care physician if you do not have one) for your aftercare needs so that they can reassess your need for medications and monitor your lab values. Today   SUBJECTIVE   Doing well.  Mild cough.  Requesting to go home. VITAL SIGNS:  Blood pressure (!) 111/54, pulse 61, temperature 98.4 F (36.9 C), temperature source Oral, resp. rate 20, height 5\' 5"  (1.651 m), weight 82.5 kg (181 lb 14.4 oz), SpO2 95 %.  I/O:   Intake/Output Summary (Last 24 hours) at 03/06/17 1322 Last data filed at 03/06/17 1300  Gross per 24 hour  Intake              720 ml  Output              700 ml  Net               20 ml    PHYSICAL EXAMINATION:  GENERAL:  83 y.o.-year-old patient lying in the bed with no acute distress.  EYES: Pupils equal, round, reactive to light and accommodation. No scleral icterus. Extraocular muscles intact.  HEENT: Head atraumatic, normocephalic. Oropharynx and nasopharynx clear.  NECK:  Supple, no jugular venous distention. No thyroid enlargement, no tenderness.  LUNGS: Normal breath sounds bilaterally, no wheezing, rales,rhonchi or crepitation. No use of accessory muscles of respiration.  CARDIOVASCULAR: S1, S2 normal. No murmurs, rubs, or gallops.  ABDOMEN: Soft, non-tender, non-distended. Bowel sounds present. No organomegaly or mass.  EXTREMITIES: Chronic pedal edema, no cyanosis, or clubbing.  NEUROLOGIC: Cranial nerves II through XII are intact. Muscle strength 5/5 in all extremities. Sensation intact. Gait not checked.  PSYCHIATRIC:  patient is alert and oriented x 3.  SKIN: No obvious rash, lesion, or ulcer.   DATA REVIEW:   CBC   Recent Labs Lab 03/03/17 1848  WBC 5.4  HGB 12.0*  HCT 35.9*  PLT 104*    Chemistries   Recent Labs Lab 03/03/17 1848 03/04/17 0634  NA 135 137  K 3.6 3.5  CL 101 104  CO2 25 24  GLUCOSE 138* 93  BUN 30* 29*  CREATININE 1.32* 1.07  CALCIUM 8.2* 8.0*  AST 49*  --   ALT 17  --    ALKPHOS 60  --   BILITOT 0.8  --     Microbiology Results   No results found for this or any previous visit (from the past 240 hour(s)).  RADIOLOGY:  No results found.   Management plans discussed with the patient, family and they are in agreement.  CODE STATUS:     Code Status Orders        Start     Ordered   03/04/17 0042  Full code  Continuous     03/04/17 0041    Code Status History    Date Active Date Inactive Code Status Order ID Comments User Context   This patient has a current code status but no historical code status.      TOTAL TIME TAKING CARE OF THIS PATIENT: *40* minutes.    Rayli Wiederhold M.D on 03/06/2017 at 1:22 PM  Between 7am to 6pm - Pager - [phone] After 6pm go to [email or web address] - Social research officer, governmentpassword [email or web address]  [facility]  Office  [phone]  CC: Primary care physician; Kandyce RudBabaoff, Marcus, MD

## 2017-03-06 NOTE — Consult Note (Signed)
Provided patient with "Living Better with Heart Failure" packet. Briefly reviewed definition of heart failure and signs and symptoms of an exacerbation. Reviewed importance of and reason behind checking weight daily in the AM, after using the bathroom, but before getting dressed. Discussed when to call the Dr= weight gain of >2lb overnight of 5lb in a week,  Discussed yellow zone= call MD: weight gain of >2lb overnight of 5lb in a week, increased swelling, increased SOB when lying down, chest discomfort, dizziness, increased fatigue Red Zone= call 911: struggle to breath, fainting or near fainting, significant chest pain Reviewed low sodium diet <2g/day-provided handout of recommended and not recommended foods  Fluid restriction <2L/day Reviewed how to read nutrition label Reviewed medication changes: lasix dose will change-talking with dr patel to consolidate dose Reviewed side effects of medications-metoprolol, furosemide, isosorbide Discussed tobacco cessation: pt does not smoke  Pt wife appears to be the primary caregiver. Seems to be a little forgetful. Pt would benefit from some in home services  Melissa D Maccia, Pharm.D, BCPS Clinical Pharmacist

## 2017-03-09 ENCOUNTER — Emergency Department: Payer: Medicare Other

## 2017-03-09 ENCOUNTER — Emergency Department
Admission: EM | Admit: 2017-03-09 | Discharge: 2017-03-09 | Disposition: A | Discharge status: Home / Self Care | Payer: Medicare Other | Attending: Emergency Medicine | Admitting: Emergency Medicine

## 2017-03-09 ENCOUNTER — Ambulatory Visit: Payer: Medicare Other | Admitting: Family

## 2017-03-09 DIAGNOSIS — R600 Localized edema: Secondary | ICD-10-CM | POA: Insufficient documentation

## 2017-03-09 DIAGNOSIS — Z951 Presence of aortocoronary bypass graft: Secondary | ICD-10-CM | POA: Diagnosis not present

## 2017-03-09 DIAGNOSIS — Z87891 Personal history of nicotine dependence: Secondary | ICD-10-CM | POA: Insufficient documentation

## 2017-03-09 DIAGNOSIS — R238 Other skin changes: Secondary | ICD-10-CM | POA: Diagnosis not present

## 2017-03-09 DIAGNOSIS — Z79899 Other long term (current) drug therapy: Secondary | ICD-10-CM | POA: Insufficient documentation

## 2017-03-09 DIAGNOSIS — I5020 Unspecified systolic (congestive) heart failure: Secondary | ICD-10-CM | POA: Insufficient documentation

## 2017-03-09 DIAGNOSIS — R609 Edema, unspecified: Secondary | ICD-10-CM

## 2017-03-09 DIAGNOSIS — I509 Heart failure, unspecified: Secondary | ICD-10-CM

## 2017-03-09 DIAGNOSIS — Z7901 Long term (current) use of anticoagulants: Secondary | ICD-10-CM | POA: Insufficient documentation

## 2017-03-09 LAB — BRAIN NATRIURETIC PEPTIDE: B NATRIURETIC PEPTIDE 5: 1156 pg/mL — AB (ref 0.0–100.0)

## 2017-03-09 LAB — CBC WITH DIFFERENTIAL/PLATELET
BASOS ABS: 0 10*3/uL (ref 0–0.1)
BASOS PCT: 1 %
Eosinophils Absolute: 0.2 10*3/uL (ref 0–0.7)
Eosinophils Relative: 5 %
HEMATOCRIT: 34.2 % — AB (ref 40.0–52.0)
HEMOGLOBIN: 11.2 g/dL — AB (ref 13.0–18.0)
LYMPHS PCT: 10 %
Lymphs Abs: 0.5 10*3/uL — ABNORMAL LOW (ref 1.0–3.6)
MCH: 36.7 pg — ABNORMAL HIGH (ref 26.0–34.0)
MCHC: 32.9 g/dL (ref 32.0–36.0)
MCV: 111.6 fL — AB (ref 80.0–100.0)
Monocytes Absolute: 0.7 10*3/uL (ref 0.2–1.0)
Monocytes Relative: 14 %
NEUTROS ABS: 3.5 10*3/uL (ref 1.4–6.5)
NEUTROS PCT: 70 %
Platelets: 103 10*3/uL — ABNORMAL LOW (ref 150–440)
RBC: 3.06 MIL/uL — AB (ref 4.40–5.90)
RDW: 15 % — ABNORMAL HIGH (ref 11.5–14.5)
WBC: 5 10*3/uL (ref 3.8–10.6)

## 2017-03-09 LAB — COMPREHENSIVE METABOLIC PANEL
ALBUMIN: 2.3 g/dL — AB (ref 3.5–5.0)
ALT: 19 U/L (ref 17–63)
AST: 54 U/L — AB (ref 15–41)
Alkaline Phosphatase: 59 U/L (ref 38–126)
Anion gap: 10 (ref 5–15)
BUN: 34 mg/dL — AB (ref 6–20)
CHLORIDE: 100 mmol/L — AB (ref 101–111)
CO2: 25 mmol/L (ref 22–32)
Calcium: 7.9 mg/dL — ABNORMAL LOW (ref 8.9–10.3)
Creatinine, Ser: 1.31 mg/dL — ABNORMAL HIGH (ref 0.61–1.24)
GFR calc Af Amer: 55 mL/min — ABNORMAL LOW (ref >60)
GFR, EST NON AFRICAN AMERICAN: 47 mL/min — AB (ref >60)
GLUCOSE: 117 mg/dL — AB (ref 65–99)
POTASSIUM: 3.8 mmol/L (ref 3.5–5.1)
SODIUM: 135 mmol/L (ref 135–145)
Total Bilirubin: 0.8 mg/dL (ref 0.3–1.2)
Total Protein: 6.8 g/dL (ref 6.5–8.1)

## 2017-03-09 LAB — PROTIME-INR
INR: 2.4
Prothrombin Time: 26 seconds — ABNORMAL HIGH (ref 11.4–15.2)

## 2017-03-09 LAB — TROPONIN I
TROPONIN I: 0.08 ng/mL — AB (ref <0.03)
Troponin I: 0.07 ng/mL (ref <0.03)

## 2017-03-09 MED ORDER — FUROSEMIDE 10 MG/ML IJ SOLN
40.0000 mg | Freq: Once | INTRAMUSCULAR | Status: AC
  Administered 2017-03-09: 40 mg via INTRAVENOUS
  Filled 2017-03-09: qty 4

## 2017-03-09 NOTE — ED Triage Notes (Signed)
He arrives today via [facility] from home with reports of bilateral peripheral edema  - weeping present upon arrival  Pt recently discharged from the hospital with CHF exacerbation  Dyspnea with exertion present also

## 2017-03-09 NOTE — Discharge Instructions (Signed)
Continue your Coumadin and the Lasix as prescribed.  Keep the legs elevated.  Return to the ER for new or worsening swelling, difficulty breathing, weakness, abnormal bleeding or bruising, or any other new or worsening symptoms that concern you.  Follow-up with your cardiologist early next week as planned.

## 2017-03-09 NOTE — ED Provider Notes (Signed)
[facility] Emergency Department Provider Note ____________________________________________   First MD Initiated Contact with Patient 03/09/17 1746     (approximate)  I have reviewed the triage vital signs and the nursing notes.   HISTORY  Chief Complaint Leg Swelling    HPI Kyle Rollins Sr. is a 83 y.o. male with a past medical history of CHF and other medical problems as noted below who presents with persistent bilateral lower extremity edema, associated with weeping clear fluid as well as some intermittent bleeding, and not associated with shortness of breath or chest pain.  Patient states that he feels well, but his visiting nurse today was concerned about the appearance of his legs and decided to call EMS to have him evaluated.  Patient was recently admitted for CHF and discharged 3 days ago.   Past Medical History:  Diagnosis Date  . CHF (congestive heart failure) (HCC)   . Hyperlipidemia     Patient Active Problem List   Diagnosis Date Noted  . Acute on chronic systolic heart failure (HCC) 03/03/2017  . AKI (acute kidney injury) (HCC) 03/03/2017    Past Surgical History:  Procedure Laterality Date  . CORONARY ARTERY BYPASS GRAFT     x2  . ENDOVASCULAR REPAIR/STENT GRAFT      Prior to Admission medications   Medication Sig Start Date End Date Taking? Authorizing Provider  furosemide (LASIX) 20 MG tablet Take 2 tablets (40 mg total) by mouth daily. 03/06/17  Yes Enedina Finner, MD  isosorbide dinitrate (ISORDIL) 30 MG tablet Take 1 tablet by mouth 3 (three) times daily. 02/01/17  Yes [provider]  lovastatin (MEVACOR) 40 MG tablet Take 2 tablets by mouth daily.  02/07/17  Yes [provider]  metoprolol tartrate (LOPRESSOR) 25 MG tablet Take 1 tablet by mouth 2 (two) times daily. 02/13/17  Yes [provider]  warfarin (COUMADIN) 2 MG tablet Take 0.5-1 tablets by mouth daily. Take 1 tablet by mouth on Tuesday,  Thursday, Friday, Saturday and Sunday. Take  tablet by mouth on Monday and Wednesday 02/24/17  Yes [provider]  nitroGLYCERIN (NITROSTAT) 0.4 MG SL tablet Place 1 tablet under the tongue. Every 5 minutes as needed for chest pain. May take up to 3 doses. 09/30/16 09/30/17  [provider]    Allergies Patient has no known allergies.  Family History  Problem Relation Age of Onset  . CAD Father     Social History Social History  Substance Use Topics  . Smoking status: Former Smoker    Types: Cigarettes  . Smokeless tobacco: Former Neurosurgeon    Types: Snuff  . Alcohol use No    Review of Systems  Constitutional: No fever/chills. Eyes: No redness. ENT: No sore throat. Cardiovascular: Denies chest pain. Respiratory: Denies shortness of breath. Gastrointestinal: No nausea, no vomiting.  Genitourinary: Negative for dysuria.  Musculoskeletal: Negative for back pain. Skin: Negative for rash.  Positive for lower extremity edema.  Neurological: Negative for headache.   ____________________________________________   PHYSICAL EXAM:  VITAL SIGNS: ED Triage Vitals [03/09/17 1748]  Enc Vitals Group     BP      Pulse      Resp      Temp      Temp src      SpO2      Weight      Height      Head Circumference      Peak Flow      Pain  Score 0     Pain Loc      Pain Edu?      Excl. in GC?     Constitutional: Alert and oriented. Well appearing and in no acute distress. Eyes: Conjunctivae are normal.  Head: Atraumatic. Nose: No congestion/rhinnorhea. Mouth/Throat: Mucous membranes are moist.   Neck: Normal range of motion.  Cardiovascular: Normal rate, regular rhythm. Grossly normal heart sounds.  Good peripheral circulation. Respiratory: Normal respiratory effort.  No retractions. Lungs CTAB. Gastrointestinal: Soft and nontender. No distention.  Genitourinary: No CVA tenderness. Musculoskeletal: 2+ symmetrical bilateral lower extremity edema.  Scattered  areas of weeping clear fluid.  A few scattered 1-2 cm superficial abrasions with no active bleeding.  No ulcers or open wounds.  2+ DP pulses bilat.  Extremities warm and well perfused.  Neurologic:  Normal speech and language. No gross focal neurologic deficits are appreciated.  Skin:  Skin is warm and dry. No rash noted. Psychiatric: Mood and affect are normal. Speech and behavior are normal.  ____________________________________________   LABS (all labs ordered are listed, but only abnormal results are displayed)  Labs Reviewed  CBC WITH DIFFERENTIAL/PLATELET - Abnormal; Notable for the following:       Result Value   RBC 3.06 (*)    Hemoglobin 11.2 (*)    HCT 34.2 (*)    MCV 111.6 (*)    MCH 36.7 (*)    RDW 15.0 (*)    Platelets 103 (*)    Lymphs Abs 0.5 (*)    All other components within normal limits  PROTIME-INR - Abnormal; Notable for the following:    Prothrombin Time 26.0 (*)    All other components within normal limits  BRAIN NATRIURETIC PEPTIDE - Abnormal; Notable for the following:    B Natriuretic Peptide 1,156.0 (*)    All other components within normal limits  COMPREHENSIVE METABOLIC PANEL - Abnormal; Notable for the following:    Chloride 100 (*)    Glucose, Bld 117 (*)    BUN 34 (*)    Creatinine, Ser 1.31 (*)    Calcium 7.9 (*)    Albumin 2.3 (*)    AST 54 (*)    GFR calc non Af Amer 47 (*)    GFR calc Af Amer 55 (*)    All other components within normal limits  TROPONIN I - Abnormal; Notable for the following:    Troponin I 0.08 (*)    All other components within normal limits  TROPONIN I - Abnormal; Notable for the following:    Troponin I 0.07 (*)    All other components within normal limits   ____________________________________________  EKG  ED ECG REPORT I, Dionne BucySebastian Jamillah Camilo, the attending physician, personally viewed and interpreted this ECG.  Date: 03/09/2017 EKG Time: 1759 Rate: 61 Rhythm: Atrial sensed ventricular paced rhythm QRS  Axis: normal Intervals: normal ST/T Wave abnormalities: normal Narrative Interpretation: no evidence of acute ischemia; no significant findings when compared to EKG of 03/03/2017  ____________________________________________  RADIOLOGY  CXR: Stable cardiomegaly and pulmonary vascular congestion; no significant change from prior  ____________________________________________   PROCEDURES  Procedure(s) performed: No    Critical Care performed: No ____________________________________________   INITIAL IMPRESSION / ASSESSMENT AND PLAN / ED COURSE  Pertinent labs & imaging results that were available during my care of the patient were reviewed by me and considered in my medical decision making (see chart for details).  83-year-old male with past history of CHF and recent admission, discharged 3  days ago, presents primarily for persistent lower extremity edema with some weeping clear fluid that his visiting nurse became concerned about.  Patient denies any other acute symptoms.  On exam, vital signs are normal, the patient is comfortable appearing and in no respiratory distress.  Lungs are clear.  Lower extremity exam is as described with edema but no ulcers or open wounds.  No bleeding.  On review of past medical records in Epic, patient was admitted to the hospital from 10/26-10/29 for generalized weakness, congestive heart failure, and acute kidney injury.  Patient states he has been compliant with his Lasix and other medications.  Presentation is consistent with chronic bilateral lower extremity edema from CHF.  No clinical evidence for pulmonary edema or other fluid overload.  Patient reports some serosanguineous type fluid, likely due to the fact that he is on Coumadin.  Plan for labs including cardiac workup, chest x-ray to rule out pulmonary edema, and INR check.  We will also give a dose of IV Lasix.  If workup does not reveal any concerning findings, anticipate discharge home  with continued outpatient diuretic and close follow-up.    ----------------------------------------- 7:43 PM on 03/09/2017 -----------------------------------------  Chest x-ray reveals stable vascular congestion.  On lab workup, patient's BNP is in the same range as previously, and no significant change on his CMP or CBC.  INR is therapeutic.  Troponin is minimally elevated/indeterminate; given no chest pain or EKG changes I have low suspicion clinically for acute cardiac event.  We will obtain a repeat after 3 hours.  ----------------------------------------- 10:07 PM on 03/09/2017 -----------------------------------------  Repeat troponin is trending slightly down.  Patient remains comfortable with no acute symptoms.  He continues to deny any shortness of breath.  He has had significant urine output during his time in the emergency department.  At this time, there is no indication for further ED observation admission.  Patient feels well and would like to go home.  Instructed to continue his warfarin and his Lasix at home.  He has follow-up with his cardiologist in 4 days, so he was instructed to keep this appointment.  Return precautions given, and patient and family members expressed understanding.  ____________________________________________   FINAL CLINICAL IMPRESSION(S) / ED DIAGNOSES  Final diagnoses:  Peripheral edema  Chronic congestive heart failure, unspecified heart failure type (HCC)      NEW MEDICATIONS STARTED DURING THIS VISIT:  New Prescriptions   No medications on file     Note:  This document was prepared using Dragon voice recognition software and may include unintentional dictation errors.    Dionne Bucy, MD 03/09/17 2215

## 2017-03-09 NOTE — ED Notes (Signed)
Date and time results received: 03/09/17 2200 (use smartphrase ".now" to insert current time)  Test: troponin Critical Value: 0.07  Name of Provider Notified: EDP  Orders Received? Or Actions Taken?:

## 2017-03-28 ENCOUNTER — Emergency Department
Admission: EM | Admit: 2017-03-28 | Discharge: 2017-03-28 | Disposition: A | Discharge status: Home / Self Care | Payer: Medicare Other | Attending: Emergency Medicine | Admitting: Emergency Medicine

## 2017-03-28 ENCOUNTER — Encounter: Payer: Self-pay | Admitting: Emergency Medicine

## 2017-03-28 DIAGNOSIS — I509 Heart failure, unspecified: Secondary | ICD-10-CM | POA: Diagnosis not present

## 2017-03-28 DIAGNOSIS — Z87891 Personal history of nicotine dependence: Secondary | ICD-10-CM | POA: Insufficient documentation

## 2017-03-28 DIAGNOSIS — Z79899 Other long term (current) drug therapy: Secondary | ICD-10-CM | POA: Insufficient documentation

## 2017-03-28 DIAGNOSIS — Z7901 Long term (current) use of anticoagulants: Secondary | ICD-10-CM | POA: Insufficient documentation

## 2017-03-28 DIAGNOSIS — R600 Localized edema: Secondary | ICD-10-CM | POA: Insufficient documentation

## 2017-03-28 DIAGNOSIS — N189 Chronic kidney disease, unspecified: Secondary | ICD-10-CM | POA: Insufficient documentation

## 2017-03-28 DIAGNOSIS — R609 Edema, unspecified: Secondary | ICD-10-CM

## 2017-03-28 LAB — CBC WITH DIFFERENTIAL/PLATELET
Basophils Absolute: 0 10*3/uL (ref 0–0.1)
Basophils Relative: 1 %
Eosinophils Absolute: 0.1 10*3/uL (ref 0–0.7)
Eosinophils Relative: 3 %
HEMATOCRIT: 35.8 % — AB (ref 40.0–52.0)
HEMOGLOBIN: 11.8 g/dL — AB (ref 13.0–18.0)
Lymphocytes Relative: 10 %
Lymphs Abs: 0.4 10*3/uL — ABNORMAL LOW (ref 1.0–3.6)
MCH: 37 pg — AB (ref 26.0–34.0)
MCHC: 33 g/dL (ref 32.0–36.0)
MCV: 112.2 fL — AB (ref 80.0–100.0)
Monocytes Absolute: 0.5 10*3/uL (ref 0.2–1.0)
Monocytes Relative: 11 %
Neutro Abs: 3.3 10*3/uL (ref 1.4–6.5)
Neutrophils Relative %: 75 %
Platelets: 108 10*3/uL — ABNORMAL LOW (ref 150–440)
RBC: 3.19 MIL/uL — AB (ref 4.40–5.90)
RDW: 14.6 % — ABNORMAL HIGH (ref 11.5–14.5)
WBC: 4.4 10*3/uL (ref 3.8–10.6)

## 2017-03-28 LAB — COMPREHENSIVE METABOLIC PANEL
ALK PHOS: 65 U/L (ref 38–126)
ALT: 19 U/L (ref 17–63)
ANION GAP: 10 (ref 5–15)
AST: 56 U/L — ABNORMAL HIGH (ref 15–41)
Albumin: 2.6 g/dL — ABNORMAL LOW (ref 3.5–5.0)
BILIRUBIN TOTAL: 1 mg/dL (ref 0.3–1.2)
BUN: 37 mg/dL — AB (ref 6–20)
CALCIUM: 8.6 mg/dL — AB (ref 8.9–10.3)
CO2: 23 mmol/L (ref 22–32)
CREATININE: 1.25 mg/dL — AB (ref 0.61–1.24)
Chloride: 99 mmol/L — ABNORMAL LOW (ref 101–111)
GFR, EST AFRICAN AMERICAN: 58 mL/min — AB (ref >60)
GFR, EST NON AFRICAN AMERICAN: 50 mL/min — AB (ref >60)
Glucose, Bld: 110 mg/dL — ABNORMAL HIGH (ref 65–99)
Potassium: 4.1 mmol/L (ref 3.5–5.1)
SODIUM: 132 mmol/L — AB (ref 135–145)
TOTAL PROTEIN: 7.6 g/dL (ref 6.5–8.1)

## 2017-03-28 LAB — TROPONIN I: Troponin I: 0.03 ng/mL (ref <0.03)

## 2017-03-28 MED ORDER — FUROSEMIDE 40 MG PO TABS
40.0000 mg | ORAL_TABLET | Freq: Every day | ORAL | 0 refills | Status: DC
Start: 2017-03-28 — End: 2017-04-14

## 2017-03-28 NOTE — ED Notes (Signed)
Both lower legswrapped in coban.  D/c inst to family

## 2017-03-28 NOTE — Discharge Instructions (Signed)
As we discussed please continue to take her 60 mg of Lasix daily in the morning.  Please take an additional 40 mg as prescribed at lunchtime.  Please follow-up with your cardiologist as well as primary care doctor as soon as possible.  Please have your home health nurse perform wraps on your legs each day that she sees you.  Return to the emergency department for any worsening swelling, any shortness of breath, chest pain or development of fever.

## 2017-03-28 NOTE — ED Notes (Signed)
ED Provider at bedside. 

## 2017-03-28 NOTE — ED Provider Notes (Signed)
[facility] Emergency Department Provider Note  Time seen: 5:30 PM  I have reviewed the triage vital signs and the nursing notes.   HISTORY  Chief Complaint Leg Swelling    HPI Kyle Balesrvin C Lapinski Sr. is a 83 y.o. male with a past medical history of CHF, hyperlipidemia, CKD, CABG, presents to the emergency department for lower extremity edema.  According to the patient and his wife for the past 3 weeks the patient has had progressively worsening lower extremity edema which has been weeping for the past 1-2 weeks.  Patient has home health 2-3 times per week, they came today and recommended that he go see a physician for evaluation.  Wife states they called the primary physician as well as the cardiologist but neither could see him today so they brought him to the emergency department.  denies any fever or leg pain.  Patient states he always has a mild amount of swelling but it has never gotten this bad.  Patient takes 60 mg of Lasix daily.   Past Medical History:  Diagnosis Date  . CHF (congestive heart failure) (HCC)   . Hyperlipidemia     Patient Active Problem List   Diagnosis Date Noted  . Acute on chronic systolic heart failure (HCC) 03/03/2017  . AKI (acute kidney injury) (HCC) 03/03/2017    Past Surgical History:  Procedure Laterality Date  . CORONARY ARTERY BYPASS GRAFT     x2  . ENDOVASCULAR REPAIR/STENT GRAFT      Prior to Admission medications   Medication Sig Start Date End Date Taking? Authorizing Provider  furosemide (LASIX) 20 MG tablet Take 2 tablets (40 mg total) by mouth daily. 03/06/17   Enedina FinnerPatel, Sona, MD  isosorbide dinitrate (ISORDIL) 30 MG tablet Take 1 tablet by mouth 3 (three) times daily. 02/01/17   [provider]  lovastatin (MEVACOR) 40 MG tablet Take 2 tablets by mouth daily.  02/07/17   [provider]  metoprolol tartrate (LOPRESSOR) 25 MG tablet Take 1 tablet by mouth 2 (two) times daily. 02/13/17   [provider]  nitroGLYCERIN (NITROSTAT) 0.4 MG SL tablet Place 1 tablet under the tongue. Every 5 minutes as needed for chest pain. May take up to 3 doses. 09/30/16 09/30/17  [provider]  warfarin (COUMADIN) 2 MG tablet Take 0.5-1 tablets by mouth daily. Take 1 tablet by mouth on Tuesday, Thursday, Friday, Saturday and Sunday. Take  tablet by mouth on Monday and Wednesday 02/24/17   [provider]    No Known Allergies  Family History  Problem Relation Age of Onset  . CAD Father     Social History Social History   Tobacco Use  . Smoking status: Former Smoker    Types: Cigarettes  . Smokeless tobacco: Former NeurosurgeonUser    Types: Snuff  Substance Use Topics  . Alcohol use: No  . Drug use: Not on file    Review of Systems Constitutional: Negative for fever. Cardiovascular: Negative for chest pain. Respiratory: Negative for shortness of breath. Gastrointestinal: Negative for abdominal pain Musculoskeletal: Positive for leg swelling.  Weeping. Neurological: Negative for headache All other ROS negative  ____________________________________________   PHYSICAL EXAM:  VITAL SIGNS: ED Triage Vitals [03/28/17 1415]  Enc Vitals Group     BP 124/67     Pulse Rate 68     Resp 15     Temp (!) 97.5 F (36.4 C)     Temp Source Oral     SpO2  96 %     Weight 176 lb (79.8 kg)     Height 5\' 5"  (1.651 m)     Head Circumference      Peak Flow      Pain Score 0     Pain Loc      Pain Edu?      Excl. in GC?     Constitutional: Alert and oriented. Well appearing and in no distress. Eyes: Normal exam ENT   Head: Normocephalic and atraumatic.   Mouth/Throat: Mucous membranes are moist. Cardiovascular: Normal rate, regular rhythm. No murmur Respiratory: Normal respiratory effort without tachypnea nor retractions. Breath sounds are clear  Gastrointestinal: Soft and nontender. No distention.  Musculoskeletal: 4+ lower extremity edema with bilateral weeping,  no significant erythema or tenderness. Neurologic:  Normal speech and language. No gross focal neurologic deficits Skin:  Skin is warm, dry and intact.  Psychiatric: Mood and affect are normal.  ____________________________________________    EKG  EKG reviewed and interpreted by myself shows a ventricular paced rhythm at 63 bpm with a widened QRS no significant ST changes.  ____________________________________________   INITIAL IMPRESSION / ASSESSMENT AND PLAN / ED COURSE  Pertinent labs & imaging results that were available during my care of the patient were reviewed by me and considered in my medical decision making (see chart for details).  Patient presents to the emergency department for lower extremity edema.  Differential would include CHF exacerbation, ACS, peripheral edema, cellulitis.  On examination the patient has significant lower extremity edema equal bilaterally with clear weepage.  No significant erythema to suggest cellulitis, no fever.  Normal white blood cell count.  We will add on a troponin as a precaution to rule out ACS/cardiac damage.  If troponin is normal I believe the patient could safely be discharged home with an increased Lasix dose from 60 mg daily to 60 mg in the morning and 30 mg at lunch, as well as wrappings to his lower extremities.  The wife states the home health nurse had stated that she would wrap the legs for the patient but needed an order from a doctor to do so.  Patient denies any chest pain or shortness of breath.  Troponin has resulted normal.  As the patient has no complaints of shortness of breath or chest pain I believe the patient is safe for discharge home with close PCP and cardiology follow-up.  Wife will call to make these appointments per wife.  We will wrap the patient's lower extremities and have home health nurse wrapped them at the patient's residence.  We will increase the patient's Lasix from 60 mg daily to 90 mg for the next 1 week.   I discussed return precautions for any trouble breathing or any increase in lower extremity edema or fever.  Patient and wife are agreeable to this plan of care.  ____________________________________________   FINAL CLINICAL IMPRESSION(S) / ED DIAGNOSES  Peripheral edema    Minna AntisPaduchowski, Navdeep Fessenden, MD 03/28/17 1814

## 2017-03-28 NOTE — ED Triage Notes (Signed)
[facility] leg edema. Wife reports weeping from both legs. Patient denies pain or SOB. Even and non labored respirations noted.

## 2017-03-28 NOTE — ED Notes (Signed)
Pt has swelling and drainage to both lower legs for 3 weeks.  Sx worse today.  Home health nurse wanted pt to come for eval.  No chest pain or sob.  Pt also has a cough.  Nonsmoker. No fever.  Pt alert  Family with pt

## 2017-04-10 ENCOUNTER — Other Ambulatory Visit: Payer: Self-pay

## 2017-04-10 ENCOUNTER — Emergency Department: Payer: Medicare Other

## 2017-04-10 ENCOUNTER — Inpatient Hospital Stay
Admission: EM | Admit: 2017-04-10 | Discharge: 2017-04-14 | DRG: 377 | Disposition: A | Discharge status: Skilled Nursing Facility | Payer: Medicare Other | Attending: Internal Medicine | Admitting: Internal Medicine

## 2017-04-10 DIAGNOSIS — I5023 Acute on chronic systolic (congestive) heart failure: Secondary | ICD-10-CM | POA: Diagnosis present

## 2017-04-10 DIAGNOSIS — D539 Nutritional anemia, unspecified: Secondary | ICD-10-CM | POA: Diagnosis present

## 2017-04-10 DIAGNOSIS — K7460 Unspecified cirrhosis of liver: Secondary | ICD-10-CM | POA: Diagnosis present

## 2017-04-10 DIAGNOSIS — N179 Acute kidney failure, unspecified: Secondary | ICD-10-CM | POA: Diagnosis not present

## 2017-04-10 DIAGNOSIS — R5381 Other malaise: Secondary | ICD-10-CM | POA: Diagnosis not present

## 2017-04-10 DIAGNOSIS — I482 Chronic atrial fibrillation: Secondary | ICD-10-CM | POA: Diagnosis present

## 2017-04-10 DIAGNOSIS — K921 Melena: Secondary | ICD-10-CM | POA: Diagnosis present

## 2017-04-10 DIAGNOSIS — R791 Abnormal coagulation profile: Secondary | ICD-10-CM

## 2017-04-10 DIAGNOSIS — E1151 Type 2 diabetes mellitus with diabetic peripheral angiopathy without gangrene: Secondary | ICD-10-CM | POA: Diagnosis not present

## 2017-04-10 DIAGNOSIS — E876 Hypokalemia: Secondary | ICD-10-CM | POA: Diagnosis not present

## 2017-04-10 DIAGNOSIS — T45515A Adverse effect of anticoagulants, initial encounter: Secondary | ICD-10-CM | POA: Diagnosis present

## 2017-04-10 DIAGNOSIS — I480 Paroxysmal atrial fibrillation: Secondary | ICD-10-CM | POA: Diagnosis present

## 2017-04-10 DIAGNOSIS — I2510 Atherosclerotic heart disease of native coronary artery without angina pectoris: Secondary | ICD-10-CM | POA: Diagnosis not present

## 2017-04-10 DIAGNOSIS — E875 Hyperkalemia: Secondary | ICD-10-CM | POA: Diagnosis not present

## 2017-04-10 DIAGNOSIS — R601 Generalized edema: Secondary | ICD-10-CM

## 2017-04-10 DIAGNOSIS — Z8249 Family history of ischemic heart disease and other diseases of the circulatory system: Secondary | ICD-10-CM

## 2017-04-10 DIAGNOSIS — I110 Hypertensive heart disease with heart failure: Secondary | ICD-10-CM | POA: Diagnosis not present

## 2017-04-10 DIAGNOSIS — Z87891 Personal history of nicotine dependence: Secondary | ICD-10-CM

## 2017-04-10 DIAGNOSIS — D696 Thrombocytopenia, unspecified: Secondary | ICD-10-CM | POA: Diagnosis present

## 2017-04-10 DIAGNOSIS — Z950 Presence of cardiac pacemaker: Secondary | ICD-10-CM

## 2017-04-10 DIAGNOSIS — D638 Anemia in other chronic diseases classified elsewhere: Secondary | ICD-10-CM | POA: Diagnosis present

## 2017-04-10 DIAGNOSIS — D62 Acute posthemorrhagic anemia: Secondary | ICD-10-CM | POA: Diagnosis present

## 2017-04-10 DIAGNOSIS — Z8679 Personal history of other diseases of the circulatory system: Secondary | ICD-10-CM

## 2017-04-10 DIAGNOSIS — R188 Other ascites: Secondary | ICD-10-CM | POA: Diagnosis present

## 2017-04-10 DIAGNOSIS — Z7901 Long term (current) use of anticoagulants: Secondary | ICD-10-CM

## 2017-04-10 DIAGNOSIS — E782 Mixed hyperlipidemia: Secondary | ICD-10-CM | POA: Diagnosis not present

## 2017-04-10 DIAGNOSIS — Z951 Presence of aortocoronary bypass graft: Secondary | ICD-10-CM

## 2017-04-10 DIAGNOSIS — K922 Gastrointestinal hemorrhage, unspecified: Secondary | ICD-10-CM | POA: Diagnosis present

## 2017-04-10 DIAGNOSIS — E871 Hypo-osmolality and hyponatremia: Secondary | ICD-10-CM | POA: Diagnosis not present

## 2017-04-10 HISTORY — DX: Abdominal aortic aneurysm, without rupture, unspecified: I71.40

## 2017-04-10 HISTORY — DX: Acute pancreatitis without necrosis or infection, unspecified: K85.90

## 2017-04-10 HISTORY — DX: Unspecified atrial fibrillation: I48.91

## 2017-04-10 HISTORY — DX: Atherosclerotic heart disease of native coronary artery without angina pectoris: I25.10

## 2017-04-10 HISTORY — DX: Type 2 diabetes mellitus without complications: E11.9

## 2017-04-10 HISTORY — DX: Bradycardia, unspecified: R00.1

## 2017-04-10 HISTORY — DX: Essential (primary) hypertension: I10

## 2017-04-10 HISTORY — DX: Abdominal aortic aneurysm, without rupture: I71.4

## 2017-04-10 LAB — URINALYSIS, COMPLETE (UACMP) WITH MICROSCOPIC
BILIRUBIN URINE: NEGATIVE
GLUCOSE, UA: NEGATIVE mg/dL
HGB URINE DIPSTICK: NEGATIVE
Ketones, ur: NEGATIVE mg/dL
NITRITE: NEGATIVE
PROTEIN: NEGATIVE mg/dL
SPECIFIC GRAVITY, URINE: 1.01 (ref 1.005–1.030)
pH: 5 (ref 5.0–8.0)

## 2017-04-10 LAB — BASIC METABOLIC PANEL
Anion gap: 10 (ref 5–15)
BUN: 65 mg/dL — AB (ref 6–20)
CO2: 24 mmol/L (ref 22–32)
CREATININE: 1.89 mg/dL — AB (ref 0.61–1.24)
Calcium: 8.3 mg/dL — ABNORMAL LOW (ref 8.9–10.3)
Chloride: 100 mmol/L — ABNORMAL LOW (ref 101–111)
GFR calc Af Amer: 35 mL/min — ABNORMAL LOW (ref >60)
GFR, EST NON AFRICAN AMERICAN: 30 mL/min — AB (ref >60)
Glucose, Bld: 122 mg/dL — ABNORMAL HIGH (ref 65–99)
POTASSIUM: 5.4 mmol/L — AB (ref 3.5–5.1)
SODIUM: 134 mmol/L — AB (ref 135–145)

## 2017-04-10 LAB — CBC
HEMATOCRIT: 29.5 % — AB (ref 40.0–52.0)
Hemoglobin: 9.9 g/dL — ABNORMAL LOW (ref 13.0–18.0)
MCH: 38.3 pg — ABNORMAL HIGH (ref 26.0–34.0)
MCHC: 33.6 g/dL (ref 32.0–36.0)
MCV: 113.9 fL — ABNORMAL HIGH (ref 80.0–100.0)
PLATELETS: 128 10*3/uL — AB (ref 150–440)
RBC: 2.59 MIL/uL — ABNORMAL LOW (ref 4.40–5.90)
RDW: 17 % — AB (ref 11.5–14.5)
WBC: 7.3 10*3/uL (ref 3.8–10.6)

## 2017-04-10 LAB — IRON AND TIBC
Iron: 29 ug/dL — ABNORMAL LOW (ref 45–182)
Saturation Ratios: 11 % — ABNORMAL LOW (ref 17.9–39.5)
TIBC: 261 ug/dL (ref 250–450)
UIBC: 232 ug/dL

## 2017-04-10 LAB — HEPATIC FUNCTION PANEL
ALBUMIN: 2.2 g/dL — AB (ref 3.5–5.0)
ALK PHOS: 51 U/L (ref 38–126)
ALT: 25 U/L (ref 17–63)
AST: 69 U/L — AB (ref 15–41)
BILIRUBIN INDIRECT: 0.6 mg/dL (ref 0.3–0.9)
Bilirubin, Direct: 0.3 mg/dL (ref 0.1–0.5)
TOTAL PROTEIN: 6.8 g/dL (ref 6.5–8.1)
Total Bilirubin: 0.9 mg/dL (ref 0.3–1.2)

## 2017-04-10 LAB — APTT: APTT: 69 s — AB (ref 24–36)

## 2017-04-10 LAB — PREPARE RBC (CROSSMATCH)

## 2017-04-10 LAB — PROTIME-INR
INR: 3.51
Prothrombin Time: 34.9 seconds — ABNORMAL HIGH (ref 11.4–15.2)

## 2017-04-10 LAB — TROPONIN I: TROPONIN I: 0.03 ng/mL — AB (ref <0.03)

## 2017-04-10 LAB — ABO/RH: ABO/RH(D): O POS

## 2017-04-10 LAB — FERRITIN: Ferritin: 200 ng/mL (ref 24–336)

## 2017-04-10 LAB — FOLATE: FOLATE: 6.6 ng/mL (ref >5.9)

## 2017-04-10 MED ORDER — ACETAMINOPHEN 650 MG RE SUPP: 650.0000 mg | Freq: Four times a day (QID) | RECTAL | Status: DC | PRN

## 2017-04-10 MED ORDER — HYDROCODONE-ACETAMINOPHEN 5-325 MG PO TABS
1.0000 | ORAL_TABLET | ORAL | Status: DC | PRN
  Administered 2017-04-13: 1 via ORAL
  Filled 2017-04-10: qty 1

## 2017-04-10 MED ORDER — SODIUM CHLORIDE 0.9 % IV SOLN
1.0000 g | Freq: Once | INTRAVENOUS | Status: AC
  Administered 2017-04-10: 1 g via INTRAVENOUS
  Filled 2017-04-10: qty 10

## 2017-04-10 MED ORDER — ACETAMINOPHEN 325 MG PO TABS
650.0000 mg | ORAL_TABLET | Freq: Four times a day (QID) | ORAL | Status: DC | PRN
Start: 2017-04-10 — End: 2017-04-14

## 2017-04-10 MED ORDER — SODIUM CHLORIDE 0.9% FLUSH: 3.0000 mL | INTRAVENOUS | Status: DC | PRN

## 2017-04-10 MED ORDER — PANTOPRAZOLE SODIUM 40 MG IV SOLR: 40.0000 mg | Freq: Two times a day (BID) | INTRAVENOUS | Status: DC

## 2017-04-10 MED ORDER — SODIUM CHLORIDE 0.9% FLUSH
3.0000 mL | Freq: Two times a day (BID) | INTRAVENOUS | Status: DC
  Administered 2017-04-10 – 2017-04-13 (×6): 3 mL via INTRAVENOUS

## 2017-04-10 MED ORDER — PANTOPRAZOLE SODIUM 40 MG IV SOLR: 40.0000 mg | Freq: Once | INTRAVENOUS | Status: DC

## 2017-04-10 MED ORDER — SODIUM CHLORIDE 0.9 % IV SOLN
10.0000 mL/h | Freq: Once | INTRAVENOUS | Status: AC
  Administered 2017-04-10: 10 mL/h via INTRAVENOUS

## 2017-04-10 MED ORDER — SODIUM CHLORIDE 0.9 % IV SOLN: 250.0000 mL | INTRAVENOUS | Status: DC | PRN

## 2017-04-10 MED ORDER — METOPROLOL TARTRATE 25 MG PO TABS
25.0000 mg | ORAL_TABLET | Freq: Two times a day (BID) | ORAL | Status: DC
  Administered 2017-04-10: 25 mg via ORAL
  Filled 2017-04-10 (×2): qty 1

## 2017-04-10 MED ORDER — ONDANSETRON HCL 4 MG PO TABS: 4.0000 mg | ORAL_TABLET | Freq: Four times a day (QID) | ORAL | Status: DC | PRN

## 2017-04-10 MED ORDER — ONDANSETRON HCL 4 MG/2ML IJ SOLN: 4.0000 mg | Freq: Four times a day (QID) | INTRAMUSCULAR | Status: DC | PRN

## 2017-04-10 MED ORDER — PRAVASTATIN SODIUM 20 MG PO TABS
20.0000 mg | ORAL_TABLET | Freq: Every day | ORAL | Status: DC
  Administered 2017-04-10 – 2017-04-13 (×4): 20 mg via ORAL
  Filled 2017-04-10 (×4): qty 1

## 2017-04-10 MED ORDER — FUROSEMIDE 10 MG/ML IJ SOLN
20.0000 mg | Freq: Two times a day (BID) | INTRAMUSCULAR | Status: DC
  Administered 2017-04-10 – 2017-04-12 (×4): 20 mg via INTRAVENOUS
  Filled 2017-04-10 (×4): qty 4

## 2017-04-10 MED ORDER — SODIUM CHLORIDE 0.9 % IV SOLN
80.0000 mg | Freq: Once | INTRAVENOUS | Status: AC
  Administered 2017-04-10: 16:00:00 80 mg via INTRAVENOUS
  Filled 2017-04-10: qty 80

## 2017-04-10 MED ORDER — ISOSORBIDE DINITRATE 30 MG PO TABS
30.0000 mg | ORAL_TABLET | Freq: Three times a day (TID) | ORAL | Status: DC
  Administered 2017-04-10 – 2017-04-12 (×5): 30 mg via ORAL
  Filled 2017-04-10 (×9): qty 1

## 2017-04-10 MED ORDER — SODIUM CHLORIDE 0.9 % IV SOLN
8.0000 mg/h | INTRAVENOUS | Status: DC
  Administered 2017-04-10 – 2017-04-12 (×4): 8 mg/h via INTRAVENOUS
  Filled 2017-04-10 (×4): qty 80

## 2017-04-10 MED ORDER — NITROGLYCERIN 0.4 MG SL SUBL: 0.4000 mg | SUBLINGUAL_TABLET | SUBLINGUAL | Status: DC | PRN

## 2017-04-10 MED ORDER — VITAMIN K1 10 MG/ML IJ SOLN
5.0000 mg | Freq: Once | INTRAVENOUS | Status: AC
  Administered 2017-04-10: 5 mg via INTRAVENOUS
  Filled 2017-04-10: qty 0.5

## 2017-04-10 NOTE — ED Notes (Signed)
Changed patient bed linens and cleaned up patient.

## 2017-04-10 NOTE — ED Notes (Signed)
Pt assisted with urinal to urinate.  

## 2017-04-10 NOTE — ED Triage Notes (Signed)
Pt arrives [facility] from home for weakness/fatigue, blood in stool yesterday and general decline over past few weeks. Pt INR on finger stick by home health nurse was 4.1 (takes coumadin), CBG 127. Pt has CHF, recently started on lasix. bilat leg edema, states leg pain. Pitting edema noted to both legs. Pt alert and oriented. EMS reports slipped out of chair yesterday and has skin tear to L FA, wrapped in gauze. EMS reports that pt had blood in stool yesterday, states was jelly like. Pt states started coumadin "over a week I reckon."

## 2017-04-10 NOTE — ED Notes (Signed)
Helped patient use urinal. 

## 2017-04-10 NOTE — ED Notes (Signed)
Date and time results received: 04/10/17 1255   Test: troponin Critical Value: 0.03  Name of Provider Notified: Dr. Roxan Hockeyobinson

## 2017-04-10 NOTE — H&P (Signed)
[employer] [facility] [facility] [place] [facility]   PATIENT NAME: Kyle Rollins    MR#:  [id number]  DATE OF BIRTH:  [birth year]  DATE OF ADMISSION:  04/10/2017  PRIMARY CARE PHYSICIAN: Kandyce RudBabaoff, Marcus, MD   REQUESTING/REFERRING PHYSICIAN: Willy Eddyobinson Patrick MD  CHIEF COMPLAINT:   Chief Complaint  Patient presents with  . Weakness    HISTORY OF PRESENT ILLNESS: Kyle Shownrvin Dress  is a 83 y.o. male with a known history of systolic CHF, atrial fibrillation, coronary artery disease, diabetes type 2, hyperlipidemia, hypertension presenting with complaint of generalized weakness.  According to his wife patient has had progressive shortness of breath and lower extremity swelling ongoing over the past few weeks.  However over the past 3 days she is noted to have melena.  [name] hemoglobin has dropped by 2 g compared to previously.  Patient denies any abdominal pain nausea or vomiting.  He does complain of shortness of breath as well as lower extremity swelling.  He denies any fevers chills no weight loss or weight gain    PAST MEDICAL HISTORY:   Past Medical History:  Diagnosis Date  . A-fib (HCC)   . AAA (abdominal aortic aneurysm) (HCC)   . Bradycardia   . CAD (coronary artery disease)   . CHF (congestive heart failure) (HCC)   . DM (diabetes mellitus) (HCC)   . Hyperlipidemia   . Hypertension   . Pancreatitis     PAST SURGICAL HISTORY:  Past Surgical History:  Procedure Laterality Date  . CORONARY ARTERY BYPASS GRAFT     x2  . ENDOVASCULAR REPAIR/STENT GRAFT      SOCIAL HISTORY:  Social History   Tobacco Use  . Smoking status: Former Smoker    Types: Cigarettes  . Smokeless tobacco: Former NeurosurgeonUser    Types: Snuff  Substance Use Topics  . Alcohol use: No    FAMILY HISTORY:  Family History  Problem Relation Age of Onset  . CAD Father     DRUG ALLERGIES: No Known Allergies  REVIEW OF SYSTEMS:   CONSTITUTIONAL: No fever, positive fatigue positive weakness.  EYES: No  blurred or double vision.  EARS, NOSE, AND THROAT: No tinnitus or ear pain.  RESPIRATORY: No cough, positive shortness of breath, wheezing or hemoptysis.  CARDIOVASCULAR: No chest pain, orthopnea, positive edema.  GASTROINTESTINAL: No nausea, vomiting, diarrhea or abdominal pain.  GENITOURINARY: No dysuria, hematuria.  ENDOCRINE: No polyuria, nocturia,  HEMATOLOGY: No anemia, easy bruising or bleeding SKIN: No rash or lesion. MUSCULOSKELETAL: No joint pain or arthritis.   NEUROLOGIC: No tingling, numbness, weakness.  PSYCHIATRY: No anxiety or depression.   MEDICATIONS AT HOME:  Prior to Admission medications   Medication Sig Start Date End Date Taking? Authorizing Provider  furosemide (LASIX) 40 MG tablet Take 1 tablet (40 mg total) by mouth daily. 03/28/17  Yes Minna AntisPaduchowski, Kevin, MD  isosorbide dinitrate (ISORDIL) 30 MG tablet Take 1 tablet by mouth 3 (three) times daily. 02/01/17  Yes [provider]  KLOR-CON M20 20 MEQ tablet Take 1 tablet by mouth 2 (two) times daily. 03/13/17  Yes [provider]  lovastatin (MEVACOR) 40 MG tablet Take 2 tablets by mouth daily.  02/07/17  Yes [provider]  metoprolol tartrate (LOPRESSOR) 25 MG tablet Take 1 tablet by mouth 2 (two) times daily. 02/13/17  Yes [provider]  warfarin (COUMADIN) 2 MG tablet Take 0.5-1 tablets by mouth daily. Take 1 tablet by mouth on Tuesday, Thursday, Friday, Saturday and Sunday. Take  tablet  by mouth on Monday and Wednesday 02/24/17  Yes [provider]  nitroGLYCERIN (NITROSTAT) 0.4 MG SL tablet Place 1 tablet under the tongue. Every 5 minutes as needed for chest pain. May take up to 3 doses. 09/30/16 09/30/17  [provider]      PHYSICAL EXAMINATION:   VITAL SIGNS: Blood pressure 119/61, pulse (!) 55, temperature (!) 97.5 F (36.4 C), temperature source Oral, resp. rate 19, weight 185 lb 4.8 oz (84.1 kg), SpO2 98 %.  GENERAL:  83 y.o.-year-old patient lying  in the bed with no acute distress.  EYES: Pupils equal, round, reactive to light and accommodation. No scleral icterus. Extraocular muscles intact.  HEENT: Head atraumatic, normocephalic. Oropharynx and nasopharynx clear.  NECK:  Supple, no jugular venous distention. No thyroid enlargement, no tenderness.  LUNGS: Bilateral crackles at the bases CARDIOVASCULAR: S1, S2 normal. No murmurs, rubs, or gallops.  ABDOMEN: Soft, nontender, nondistended. Bowel sounds present. No organomegaly or mass.  EXTREMITIES: 2+ pedal edema, cyanosis, or clubbing.  NEUROLOGIC: Cranial nerves II through XII are intact. Muscle strength 5/5 in all extremities. Sensation intact. Gait not checked.  PSYCHIATRIC: The patient is alert and oriented x 3.  SKIN: No obvious rash, lesion, or ulcer.   LABORATORY PANEL:   CBC Recent Labs  Lab 04/10/17 1223  WBC 7.3  HGB 9.9*  HCT 29.5*  PLT 128*  MCV 113.9*  MCH 38.3*  MCHC 33.6  RDW 17.0*   ------------------------------------------------------------------------------------------------------------------  Chemistries  Recent Labs  Lab 04/10/17 1223  NA 134*  K 5.4*  CL 100*  CO2 24  GLUCOSE 122*  BUN 65*  CREATININE 1.89*  CALCIUM 8.3*  AST 69*  ALT 25  ALKPHOS 51  BILITOT 0.9   ------------------------------------------------------------------------------------------------------------------ estimated creatinine clearance is 27.5 mL/min (A) (by C-G formula based on SCr of 1.89 mg/dL (H)). ------------------------------------------------------------------------------------------------------------------ No results for input(s): TSH, T4TOTAL, T3FREE, THYROIDAB in the last 72 hours.  Invalid input(s): FREET3   Coagulation profile Recent Labs  Lab 04/10/17 1223  INR 3.51   ------------------------------------------------------------------------------------------------------------------- No results for input(s): DDIMER in the last 72  hours. -------------------------------------------------------------------------------------------------------------------  Cardiac Enzymes Recent Labs  Lab 04/10/17 1223  TROPONINI 0.03*   ------------------------------------------------------------------------------------------------------------------ Invalid input(s): POCBNP  ---------------------------------------------------------------------------------------------------------------  Urinalysis    Component Value Date/Time   COLORURINE YELLOW (A) 04/10/2017 1339   APPEARANCEUR CLEAR (A) 04/10/2017 1339   LABSPEC 1.010 04/10/2017 1339   PHURINE 5.0 04/10/2017 1339   GLUCOSEU NEGATIVE 04/10/2017 1339   HGBUR NEGATIVE 04/10/2017 1339   BILIRUBINUR NEGATIVE 04/10/2017 1339   KETONESUR NEGATIVE 04/10/2017 1339   PROTEINUR NEGATIVE 04/10/2017 1339   NITRITE NEGATIVE 04/10/2017 1339   LEUKOCYTESUR SMALL (A) 04/10/2017 1339     RADIOLOGY: Dg Chest 2 View  Result Date: 04/10/2017 CLINICAL DATA:  Weakness, fatigue, blood in stool BILATERAL leg edema and pain, on Coumadin, slipped out of chair yesterday, history CHF, CABG EXAM: CHEST  2 VIEW COMPARISON:  03/09/2017 FINDINGS: LEFT subclavian pacemaker lead projects at RIGHT ventricle unchanged. Upper normal size of cardiac silhouette post CABG. Atherosclerotic calcification aorta. Decreased lung volumes with scattered atelectasis LEFT base and RIGHT mid lung. No definite infiltrate, pleural effusion or pneumothorax. Mild central peribronchial thickening. Bones demineralized. IMPRESSION: Post CABG and pacemaker. Bronchitic changes with scattered atelectasis in both lungs. Electronically Signed   By: Ulyses SouthwardMark  Boles M.D.   On: 04/10/2017 13:03   Dg Elbow Complete Left  Result Date: 04/10/2017 CLINICAL DATA:  Patient slipped out of a chair yesterday and has bruising and  skin tear is over the left elbow. EXAM: LEFT ELBOW - COMPLETE 3+ VIEW COMPARISON:  None in PACs FINDINGS: The bones are  subjectively adequately mineralized. The radial head is intact. The olecranon and distal humerus are intact. There is no joint effusion. Mild soft tissue swelling posteriorly may reflect olecranon bursitis. IMPRESSION: There is no acute fracture nor dislocation of the left elbow. Electronically Signed   By: David  [place] M.D.   On: 04/10/2017 14:28   Ct Head Wo Contrast  Result Date: 04/10/2017 CLINICAL DATA:  Progressive weakness and fatigue over the past few weeks. EXAM: CT HEAD WITHOUT CONTRAST TECHNIQUE: Contiguous axial images were obtained from the base of the skull through the vertex without intravenous contrast. COMPARISON:  None. FINDINGS: Brain: There is some cortical atrophy. No evidence of acute abnormality including hemorrhage, infarct, mass lesion, mass effect, midline shift or abnormal extra-axial fluid collection. No hydrocephalus or pneumocephalus. Vascular: Atherosclerosis noted. Skull: Intact. Sinuses/Orbits: Negative. Other: None. IMPRESSION: No acute abnormality. Mild atrophy. Atherosclerosis. Electronically Signed   By: Drusilla Kanner M.D.   On: 04/10/2017 13:07    EKG: Orders placed or performed during the hospital encounter of 04/10/17  . ED EKG within 10 minutes  . ED EKG within 10 minutes  . EKG 12-Lead  . EKG 12-Lead    IMPRESSION AND PLAN: Patient is a 83 year old white male with multiple medical problems presenting with generalized weakness and GI bleed  1.  Upper GI bleed with melena Stop Coumadin ED physician has contacted the on-call GI physician Also the ER physician has given patient vitamin K and is transfusing him blood. Will monitor H&H closely  2.  Acute on chronic systolic CHF I would treat with IV Lasix Obtain echocardiogram of the heart Patient has a creatinine that is elevated needs to monitor this with IV Lasix  3.  Chronic atrial fibrillation Hold Coumadin Monitor heart rate Continue metoprolol  4.  Coronary artery disease continue  isosorbide mononitrate and metoprolol  5.  Hyperlipidemia unspecified continue Mevacor  6.  Miscellaneous SCDs for DVT prophylaxis   All the records are reviewed and case discussed with ED provider. Management plans discussed with the patient, family and they are in agreement.  CODE STATUS: Code Status History    Date Active Date Inactive Code Status Order ID Comments User Context   03/04/2017 00:41 03/06/2017 19:28 Full Code [id number]  Arnaldo Natal, MD Inpatient       TOTAL TIME TAKING CARE OF THIS PATIENT: 55 minutes.    Auburn Bilberry M.D on 04/10/2017 at 3:42 PM  Between 7am to 6pm - Pager - [phone]  After 6pm go to [email or web address] - password [email or web address] [facility]  [facility]  Office  [phone]  CC: Primary care physician; Kandyce Rud, MD

## 2017-04-10 NOTE — ED Notes (Signed)
Black tarry stool noted. Fecal occult positive by Dr. Roxan Hockeyobinson.

## 2017-04-10 NOTE — ED Provider Notes (Signed)
[facility] Emergency Department Provider Note    First MD Initiated Contact with Patient 04/10/17 1227     (approximate)  I have reviewed the triage vital signs and the nursing notes.   HISTORY  Chief Complaint Weakness    HPI Kyle LAWS Sr. is a 83 y.o. male history of congestive heart failure status post CABG as well as is on Coumadin for his congestive heart failure presents with General lysed weakness fatigue that is got worse for the past week or 2.  Got to the point where the patient is no longer able to ambulate.  Home health nurse came to evaluate patient and noted that he was having dark tarry stools.  States that he has been having worsening swelling the legs and abdomen.  Patient denies any history of GI bleeds.  Is having significant weakness and decline.  Denies any pain.  Has been compliant with his medications.  Past Medical History:  Diagnosis Date  . CHF (congestive heart failure) (HCC)   . Hyperlipidemia    Family History  Problem Relation Age of Onset  . CAD Father    Past Surgical History:  Procedure Laterality Date  . CORONARY ARTERY BYPASS GRAFT     x2  . ENDOVASCULAR REPAIR/STENT GRAFT     Patient Active Problem List   Diagnosis Date Noted  . Acute on chronic systolic heart failure (HCC) 03/03/2017  . AKI (acute kidney injury) (HCC) 03/03/2017      Prior to Admission medications   Medication Sig Start Date End Date Taking? Authorizing Provider  furosemide (LASIX) 40 MG tablet Take 1 tablet (40 mg total) by mouth daily. 03/28/17  Yes Minna Antis, MD  isosorbide dinitrate (ISORDIL) 30 MG tablet Take 1 tablet by mouth 3 (three) times daily. 02/01/17  Yes [provider]  KLOR-CON M20 20 MEQ tablet Take 1 tablet by mouth 2 (two) times daily. 03/13/17  Yes [provider]  lovastatin (MEVACOR) 40 MG tablet Take 2 tablets by mouth daily.  02/07/17  Yes [provider]  metoprolol tartrate  (LOPRESSOR) 25 MG tablet Take 1 tablet by mouth 2 (two) times daily. 02/13/17  Yes [provider]  warfarin (COUMADIN) 2 MG tablet Take 0.5-1 tablets by mouth daily. Take 1 tablet by mouth on Tuesday, Thursday, Friday, Saturday and Sunday. Take  tablet by mouth on Monday and Wednesday 02/24/17  Yes [provider]  nitroGLYCERIN (NITROSTAT) 0.4 MG SL tablet Place 1 tablet under the tongue. Every 5 minutes as needed for chest pain. May take up to 3 doses. 09/30/16 09/30/17  [provider]    Allergies Patient has no known allergies.    Social History Social History   Tobacco Use  . Smoking status: Former Smoker    Types: Cigarettes  . Smokeless tobacco: Former Neurosurgeon    Types: Snuff  Substance Use Topics  . Alcohol use: No  . Drug use: Not on file    Review of Systems Patient denies headaches, rhinorrhea, blurry vision, numbness, shortness of breath, chest pain, edema, cough, abdominal pain, nausea, vomiting, diarrhea, dysuria, fevers, rashes or hallucinations unless otherwise stated above in HPI. ____________________________________________   PHYSICAL EXAM:  VITAL SIGNS: Vitals:   04/10/17 1404 04/10/17 1430  BP: 114/69 112/64  Pulse: 64 62  Resp: 13 11  Temp:    SpO2: 96% 96%    Constitutional: Alert and oriented.  Very ill-appearing  eyes: Conjunctivae are normal.  Head: Atraumatic. Nose: No congestion/rhinnorhea.  Mouth/Throat: Mucous membranes are moist.   Neck: No stridor. Painless ROM.  Cardiovascular: Normal rate, regular rhythm. Grossly normal heart sounds.  Good peripheral circulation. Respiratory: Normal respiratory effort.  No retractions. Lungs with diminished breath sounds in posterior bases Gastrointestinal: Soft and nontender.  There is anasarca and evidence of a yeast infection in the fold of the left pannus.  No crepitus or blistering. Genitourinary: Homero Fellers melena guaiac positive stools does have some skin tags and hemorrhoids  that do not appear to be actively bleeding Musculoskeletal: No lower extremity tenderness nor edema.  No joint effusions. Neurologic:  Normal speech and language. No gross focal neurologic deficits are appreciated. No facial droop Skin:  Skin is warm, dry and intact. No rash noted. Psychiatric: Appropriate  ____________________________________________   LABS (all labs ordered are listed, but only abnormal results are displayed)  Results for orders placed or performed during the hospital encounter of 04/10/17 (from the past 24 hour(s))  Basic metabolic panel     Status: Abnormal   Collection Time: 04/10/17 12:23 PM  Result Value Ref Range   Sodium 134 (L) 135 - 145 mmol/L   Potassium 5.4 (H) 3.5 - 5.1 mmol/L   Chloride 100 (L) 101 - 111 mmol/L   CO2 24 22 - 32 mmol/L   Glucose, Bld 122 (H) 65 - 99 mg/dL   BUN 65 (H) 6 - 20 mg/dL   Creatinine, Ser 6.29 (H) 0.61 - 1.24 mg/dL   Calcium 8.3 (L) 8.9 - 10.3 mg/dL   GFR calc non Af Amer 30 (L) >60 mL/min   GFR calc Af Amer 35 (L) >60 mL/min   Anion gap 10 5 - 15  CBC     Status: Abnormal   Collection Time: 04/10/17 12:23 PM  Result Value Ref Range   WBC 7.3 3.8 - 10.6 K/uL   RBC 2.59 (L) 4.40 - 5.90 MIL/uL   Hemoglobin 9.9 (L) 13.0 - 18.0 g/dL   HCT 52.8 (L) 41.3 - 24.4 %   MCV 113.9 (H) 80.0 - 100.0 fL   MCH 38.3 (H) 26.0 - 34.0 pg   MCHC 33.6 32.0 - 36.0 g/dL   RDW 01.0 (H) 27.2 - 53.6 %   Platelets 128 (L) 150 - 440 K/uL  Troponin I     Status: Abnormal   Collection Time: 04/10/17 12:23 PM  Result Value Ref Range   Troponin I 0.03 (HH) <0.03 ng/mL  Protime-INR     Status: Abnormal   Collection Time: 04/10/17 12:23 PM  Result Value Ref Range   Prothrombin Time 34.9 (H) 11.4 - 15.2 seconds   INR 3.51   APTT     Status: Abnormal   Collection Time: 04/10/17 12:23 PM  Result Value Ref Range   aPTT 69 (H) 24 - 36 seconds  Hepatic function panel     Status: Abnormal   Collection Time: 04/10/17 12:23 PM  Result Value Ref Range    Total Protein 6.8 6.5 - 8.1 g/dL   Albumin 2.2 (L) 3.5 - 5.0 g/dL   AST 69 (H) 15 - 41 U/L   ALT 25 17 - 63 U/L   Alkaline Phosphatase 51 38 - 126 U/L   Total Bilirubin 0.9 0.3 - 1.2 mg/dL   Bilirubin, Direct 0.3 0.1 - 0.5 mg/dL   Indirect Bilirubin 0.6 0.3 - 0.9 mg/dL  ABO/Rh     Status: None   Collection Time: 04/10/17 12:23 PM  Result Value Ref Range   ABO/RH(D) O POS  Urinalysis, Complete w Microscopic     Status: Abnormal   Collection Time: 04/10/17  1:39 PM  Result Value Ref Range   Color, Urine YELLOW (A) YELLOW   APPearance CLEAR (A) CLEAR   Specific Gravity, Urine 1.010 1.005 - 1.030   pH 5.0 5.0 - 8.0   Glucose, UA NEGATIVE NEGATIVE mg/dL   Hgb urine dipstick NEGATIVE NEGATIVE   Bilirubin Urine NEGATIVE NEGATIVE   Ketones, ur NEGATIVE NEGATIVE mg/dL   Protein, ur NEGATIVE NEGATIVE mg/dL   Nitrite NEGATIVE NEGATIVE   Leukocytes, UA SMALL (A) NEGATIVE   RBC / HPF 0-5 0 - 5 RBC/hpf   WBC, UA 0-5 0 - 5 WBC/hpf   Bacteria, UA RARE (A) NONE SEEN   Squamous Epithelial / LPF 0-5 (A) NONE SEEN   Mucus PRESENT    Hyaline Casts, UA PRESENT   Prepare RBC     Status: None (Preliminary result)   Collection Time: 04/10/17  1:51 PM  Result Value Ref Range   Order Confirmation PENDING   Type and screen [facility]     Status: None (Preliminary result)   Collection Time: 04/10/17  1:51 PM  Result Value Ref Range   ABO/RH(D) O POS    Antibody Screen POS    Sample Expiration 04/13/2017    Antibody Identification PENDING    ____________________________________________  EKG My review and personal interpretation at Time: 12:17   Indication: weakness  Rate: 75  Rhythm: paced rhythm Axis: left Other: no sgarbossa criteria, ____________________________________________  RADIOLOGY  I personally reviewed all radiographic images ordered to evaluate for the above acute complaints and reviewed radiology reports and findings.  These findings were personally  discussed with the patient.  Please see medical record for radiology report.  ____________________________________________   PROCEDURES  Procedure(s) performed:  .Critical Care Performed by: Willy Eddyobinson, Blayde Bacigalupi, MD Authorized by: Willy Eddyobinson, Quadarius Henton, MD   Critical care provider statement:    Critical care time (minutes):  30   Critical care time was exclusive of:  Separately billable procedures and treating other patients   Critical care was necessary to treat or prevent imminent or life-threatening deterioration of the following conditions: GI bleed requiring transfusion.   Critical care was time spent personally by me on the following activities:  Development of treatment plan with patient or surrogate, discussions with consultants, evaluation of patient's response to treatment, examination of patient, obtaining history from patient or surrogate, ordering and performing treatments and interventions, ordering and review of laboratory studies, ordering and review of radiographic studies, pulse oximetry, re-evaluation of patient's condition and review of old charts      Critical Care performed: yes ____________________________________________   INITIAL IMPRESSION / ASSESSMENT AND PLAN / ED COURSE  Pertinent labs & imaging results that were available during my care of the patient were reviewed by me and considered in my medical decision making (see chart for details).  DDX: GI bleed, mass, congestive heart failure, sepsis, UTI, ACS, CVA, subdural  Kyle Balesrvin C Levins Sr. is a 83 y.o. who presents to the ED with symptoms as described above.  Patient does have frank melena on exam and has dropped 2 g on his hemoglobin.  Denying any chest pain but does describe weakness and does have a mildly elevated troponin which is most likely chronic due to his underlying congestive heart failure.  No hypoxia no hypotension but based on his frailty as well as evidence of a supratherapeutic INR we will give  the patient vitamin K reversal  as well as transfuse.  I spoke with gastroenterology and spoke with hospitalist for admission for further medical therapy.      ____________________________________________   FINAL CLINICAL IMPRESSION(S) / ED DIAGNOSES  Final diagnoses:  Melena  Supratherapeutic INR  Anasarca      NEW MEDICATIONS STARTED DURING THIS VISIT:  This SmartLink is deprecated. Use AVSMEDLIST instead to display the medication list for a patient.   Note:  This document was prepared using Dragon voice recognition software and may include unintentional dictation errors.    Willy Eddyobinson, Dequane Strahan, MD 04/10/17 [phone]

## 2017-04-11 ENCOUNTER — Inpatient Hospital Stay: Payer: Medicare Other

## 2017-04-11 DIAGNOSIS — K921 Melena: Secondary | ICD-10-CM | POA: Diagnosis not present

## 2017-04-11 DIAGNOSIS — D638 Anemia in other chronic diseases classified elsewhere: Secondary | ICD-10-CM | POA: Diagnosis not present

## 2017-04-11 DIAGNOSIS — R195 Other fecal abnormalities: Secondary | ICD-10-CM | POA: Diagnosis not present

## 2017-04-11 LAB — CBC
HCT: 29.7 % — ABNORMAL LOW (ref 40.0–52.0)
Hemoglobin: 10.1 g/dL — ABNORMAL LOW (ref 13.0–18.0)
MCH: 35.7 pg — ABNORMAL HIGH (ref 26.0–34.0)
MCHC: 33.9 g/dL (ref 32.0–36.0)
MCV: 105.2 fL — AB (ref 80.0–100.0)
PLATELETS: 105 10*3/uL — AB (ref 150–440)
RBC: 2.82 MIL/uL — ABNORMAL LOW (ref 4.40–5.90)
RDW: 23.3 % — AB (ref 11.5–14.5)
WBC: 5.6 10*3/uL (ref 3.8–10.6)

## 2017-04-11 LAB — BASIC METABOLIC PANEL
ANION GAP: 9 (ref 5–15)
BUN: 67 mg/dL — ABNORMAL HIGH (ref 6–20)
CO2: 23 mmol/L (ref 22–32)
CREATININE: 1.75 mg/dL — AB (ref 0.61–1.24)
Calcium: 8.1 mg/dL — ABNORMAL LOW (ref 8.9–10.3)
Chloride: 101 mmol/L (ref 101–111)
GFR calc Af Amer: 39 mL/min — ABNORMAL LOW (ref >60)
GFR calc non Af Amer: 33 mL/min — ABNORMAL LOW (ref >60)
Glucose, Bld: 104 mg/dL — ABNORMAL HIGH (ref 65–99)
Potassium: 4.4 mmol/L (ref 3.5–5.1)
Sodium: 133 mmol/L — ABNORMAL LOW (ref 135–145)

## 2017-04-11 LAB — VITAMIN B12: Vitamin B-12: 520 pg/mL (ref 180–914)

## 2017-04-11 LAB — HEMOGLOBIN: Hemoglobin: 10.5 g/dL — ABNORMAL LOW (ref 13.0–18.0)

## 2017-04-11 LAB — PROTIME-INR
INR: 1.42
Prothrombin Time: 17.2 seconds — ABNORMAL HIGH (ref 11.4–15.2)

## 2017-04-11 MED ORDER — METOPROLOL TARTRATE 25 MG PO TABS
12.5000 mg | ORAL_TABLET | Freq: Two times a day (BID) | ORAL | Status: DC
  Administered 2017-04-12 (×2): 12.5 mg via ORAL
  Filled 2017-04-11 (×4): qty 1

## 2017-04-11 MED ORDER — FUROSEMIDE 10 MG/ML IJ SOLN
20.0000 mg | Freq: Once | INTRAMUSCULAR | Status: AC
  Administered 2017-04-11: 20 mg via INTRAVENOUS
  Filled 2017-04-11: qty 4

## 2017-04-11 NOTE — Consult Note (Signed)
Kyle Darby, MD [address]  [place]  [place], [place] [address]  Main: [phone]  Fax: [phone] Pager: [phone]   Consultation  Referring Provider:     No ref. provider found Primary Care Physician:  Derinda Late, MD Primary Gastroenterologist: None          Reason for Consultation:   Dark stools  Date of Admission:  04/10/2017 Date of Consultation:  04/11/2017         HPI:   Kyle Rollins. is a 83 y.o. male with extensive cardiac history including CABG in 1982, redo CABG in 2000, CHF, EF 50% with severe TR, moderate MR, hypertension, hyperlipidemia, paroxysmal A. fib, dual-chamber pacemaker for sick sinus syndrome, AAA endovascular repair in 2004, on anticoagulation with Coumadin, diabetes presents with a few weeks history of progressively worsening bilateral swelling of legs, shortness of breath, fatigue and his wife reported that his stools are dark. He lives at home and has a home health aid.   Chart review revelas that he has chronic macrocytic anemia as well as thrombocytopenia and it is unclear whether or not he has chronic liver disease/cirrhosis.  He had several ER visits and recent admission secondary to CHF exacerbation in last 2 months. He denies abd pain, n/v. He c/o generalized body pain. He had a BM during my encounter with him and the stool was soft, dark green. His wife confirmed that she noticed the same type of stool at home  On admission, he is found to have INR of 3.51, hemoglobin 10.1, MCV 105, platelets 105.  His hemoglobin was 12 on 03/03/2017.  He also has AK I with elevated BUN out of proportion to creatinine.  His baseline hemoglobin is usually between 10 and 12 in the last 2 years. He started on Protonix drip and on clear liquid diet since admission.  His hemoglobin has been stable in the last 24 hours.  He is currently being managed for CHF exacerbation.  He is not on PPI as outpatient. According to his wife, swelling in his  legs has remarkably improved  NSAIDs: None  Antiplts/Anticoagulants/Anti thrombotics: Coumadin  GI Procedures: Did not have EGD before Had colonoscopy several years ago  Past Medical History:  Diagnosis Date  . A-fib ([place])   . AAA (abdominal aortic aneurysm) ([place])   . Bradycardia   . CAD (coronary artery disease)   . CHF (congestive heart failure) ([place])   . DM (diabetes mellitus) ([place])   . Hyperlipidemia   . Hypertension   . Pancreatitis     Past Surgical History:  Procedure Laterality Date  . CORONARY ARTERY BYPASS GRAFT     x2  . ENDOVASCULAR REPAIR/STENT GRAFT      Prior to Admission medications   Medication Sig Start Date End Date Taking? Authorizing Provider  furosemide (LASIX) 40 MG tablet Take 1 tablet (40 mg total) by mouth daily. 03/28/17  Yes Harvest Dark, MD  isosorbide dinitrate (ISORDIL) 30 MG tablet Take 1 tablet by mouth 3 (three) times daily. 02/01/17  Yes [provider]  KLOR-CON M20 20 MEQ tablet Take 1 tablet by mouth 2 (two) times daily. 03/13/17  Yes [provider]  lovastatin (MEVACOR) 40 MG tablet Take 2 tablets by mouth daily.  02/07/17  Yes [provider]  metoprolol tartrate (LOPRESSOR) 25 MG tablet Take 1 tablet by mouth 2 (two) times daily. 02/13/17  Yes [provider]  warfarin (COUMADIN) 2 MG tablet Take 0.5-1 tablets  by mouth daily. Take 1 tablet by mouth on Tuesday, Thursday, Friday, Saturday and Sunday. Take  tablet by mouth on Monday and Wednesday 02/24/17  Yes [provider]  nitroGLYCERIN (NITROSTAT) 0.4 MG SL tablet Place 1 tablet under the tongue. Every 5 minutes as needed for chest pain. May take up to 3 doses. 09/30/16 09/30/17  [provider]    Family History  Problem Relation Age of Onset  . CAD Father      Social History   Tobacco Use  . Smoking status: Former Smoker    Types: Cigarettes  . Smokeless tobacco: Former Systems developer    Types: Snuff  Substance Use Topics  .  Alcohol use: No  . Drug use: No    Allergies as of 04/10/2017  . (No Known Allergies)    Review of Systems:    All systems reviewed and negative except where noted in HPI.   Physical Exam:  Vital signs in last 24 hours: Temp:  [97.3 F (36.3 C)-97.7 F (36.5 C)] 97.7 F (36.5 C) (12/04 0453) Pulse Rate:  [59-64] 64 (12/04 1224) Resp:  [11-19] 19 (12/04 0453) BP: (100-117)/(51-65) 112/53 (12/04 1224) SpO2:  [96 %-100 %] 97 % (12/04 1224) Weight:  [177 lb 9.6 oz (80.6 kg)] 177 lb 9.6 oz (80.6 kg) (12/03 1803) Last BM Date: 04/11/17 General:  Ill appearing, weak, c/o generalized pain, deconditioned Head:  Normocephalic and atraumatic. Eyes:   No icterus.   Conjunctiva pink. PERRLA. Ears:  Normal auditory acuity. Neck:  Supple; no masses or thyroidomegaly Lungs: Respirations even and unlabored. Lungs clear to auscultation bilaterally.   No wheezes, crackles, or rhonchi.  Heart:  Regular rate and rhythm; systolic murmur Abdomen:  Soft, nondistended, nontender. Normal bowel sounds. No appreciable masses or hepatomegaly.  No rebound or guarding.  Rectal:  Soft, dark green stool Msk:  Symmetrical without gross deformities.  Strength weak in all extremities Extremities: 3+ edema Neurologic:  Alert and oriented x3;  grossly normal neurologically. Skin:  Purpurea on his back and large erythematous spots in his both legs most likely from coumadin use   LAB RESULTS: CBC Latest Ref Rng & Units 04/11/2017 04/11/2017 04/10/2017  WBC 3.8 - 10.6 K/uL - 5.6 7.3  Hemoglobin 13.0 - 18.0 g/dL 10.5(L) 10.1(L) 9.9(L)  Hematocrit 40.0 - 52.0 % - 29.7(L) 29.5(L)  Platelets 150 - 440 K/uL - 105(L) 128(L)    BMET BMP Latest Ref Rng & Units 04/11/2017 04/10/2017 03/28/2017  Glucose 65 - 99 mg/dL 104(H) 122(H) 110(H)  BUN 6 - 20 mg/dL 67(H) 65(H) 37(H)  Creatinine 0.61 - 1.24 mg/dL 1.75(H) 1.89(H) 1.25(H)  Sodium 135 - 145 mmol/L 133(L) 134(L) 132(L)  Potassium 3.5 - 5.1 mmol/L 4.4 5.4(H) 4.1    Chloride 101 - 111 mmol/L 101 100(L) 99(L)  CO2 22 - 32 mmol/L 23 24 23   Calcium 8.9 - 10.3 mg/dL 8.1(L) 8.3(L) 8.6(L)    LFT Hepatic Function Latest Ref Rng & Units 04/10/2017 03/28/2017 03/09/2017  Total Protein 6.5 - 8.1 g/dL 6.8 7.6 6.8  Albumin 3.5 - 5.0 g/dL 2.2(L) 2.6(L) 2.3(L)  AST 15 - 41 U/L 69(H) 56(H) 54(H)  ALT 17 - 63 U/L 25 19 19   Alk Phosphatase 38 - 126 U/L 51 65 59  Total Bilirubin 0.3 - 1.2 mg/dL 0.9 1.0 0.8  Bilirubin, Direct 0.1 - 0.5 mg/dL 0.3 - -     STUDIES: Dg Chest 2 View  Result Date: 04/10/2017 CLINICAL DATA:  Weakness, fatigue, blood in stool BILATERAL  leg edema and pain, on Coumadin, slipped out of chair yesterday, history CHF, CABG EXAM: CHEST  2 VIEW COMPARISON:  03/09/2017 FINDINGS: LEFT subclavian pacemaker lead projects at RIGHT ventricle unchanged. Upper normal size of cardiac silhouette post CABG. Atherosclerotic calcification aorta. Decreased lung volumes with scattered atelectasis LEFT base and RIGHT mid lung. No definite infiltrate, pleural effusion or pneumothorax. Mild central peribronchial thickening. Bones demineralized. IMPRESSION: Post CABG and pacemaker. Bronchitic changes with scattered atelectasis in both lungs. Electronically Signed   By: Lavonia Dana M.D.   On: 04/10/2017 13:03   Dg Elbow Complete Left  Result Date: 04/10/2017 CLINICAL DATA:  Patient slipped out of a chair yesterday and has bruising and skin tear is over the left elbow. EXAM: LEFT ELBOW - COMPLETE 3+ VIEW COMPARISON:  None in PACs FINDINGS: The bones are subjectively adequately mineralized. The radial head is intact. The olecranon and distal humerus are intact. There is no joint effusion. Mild soft tissue swelling posteriorly may reflect olecranon bursitis. IMPRESSION: There is no acute fracture nor dislocation of the left elbow. Electronically Signed   By: David  Martinique M.D.   On: 04/10/2017 14:28   Ct Head Wo Contrast  Result Date: 04/10/2017 CLINICAL DATA:  Progressive  weakness and fatigue over the past few weeks. EXAM: CT HEAD WITHOUT CONTRAST TECHNIQUE: Contiguous axial images were obtained from the base of the skull through the vertex without intravenous contrast. COMPARISON:  None. FINDINGS: Brain: There is some cortical atrophy. No evidence of acute abnormality including hemorrhage, infarct, mass lesion, mass effect, midline shift or abnormal extra-axial fluid collection. No hydrocephalus or pneumocephalus. Vascular: Atherosclerosis noted. Skull: Intact. Sinuses/Orbits: Negative. Other: None. IMPRESSION: No acute abnormality. Mild atrophy. Atherosclerosis. Electronically Signed   By: Inge Rise M.D.   On: 04/10/2017 13:07      Impression / Plan:   Kyle CUEVAS Sr. is a 83 y.o. male with extensive cardiac history, on chronic anticoagulation with Coumadin presents with CHF exacerbation and GI consulted for dark stools. He does not have active GI bleed based on my exam today.  He does not have iron deficiency anemia. Patient has chronic macrocytic anemia and thrombocytopenia and his hemoglobin has been stable since admission. His iron studies are consistent with anemia of chronic disease.  He does have normal B12 and folate levels.  - Can change protonix drip to Protonix daily - There is no indication for EGD at this time as pt is not actively bleeding - Given his cardiac status and acute on chronic CHF, performing EGD at this time is associated with more risks than benefits.  - Maintain full liquid diet - Hold Coumadin for now since INR is above therapeutic range - Monitor CBC, PT/INR closely - Recommend ultrasound liver to evaluate for cirrhosis, portal hypertension as he is at risk for developing cardiac cirrhosis or Nash cirrhosis.  And, he does have chronic macrocytic anemia and thrombocytopenia of unclear etiology - Discussed my recommendations with patient and his wife who are agreeable  Thank you for involving me in the care of this patient.  Will follow along with you    LOS: 1 day   Sherri Sear, MD  04/11/2017, 3:49 PM   Note: This dictation was prepared with Dragon dictation along with smaller phrase technology. Any transcriptional errors that result from this process are unintentional.

## 2017-04-11 NOTE — Progress Notes (Signed)
Per Dr. Allegra LaiVanga okay for nurse to place order for full liquid diet after pt comes back from ultrasounds and to keep him NPO after midnight.

## 2017-04-11 NOTE — Progress Notes (Signed)
Per MD okay for RN to hold Lopressor. BP 100/51.

## 2017-04-11 NOTE — Progress Notes (Signed)
Per MD okay for RN to place NPO diet order. Pt is having abdominal ultrasound.

## 2017-04-11 NOTE — Progress Notes (Signed)
Patient complaining of shortness of breath and has mild wheezing. Notified Dr Sheryle Hailiamond and he ordered 20 mg Lasix IV. Will continue to monitor patient.

## 2017-04-11 NOTE — Progress Notes (Signed)
[facility] - [place] at [facility]   PATIENT NAME: Kyle Rollins    MR#:  [id number]  DATE OF BIRTH:  Jun 30, 1927  SUBJECTIVE:  CHIEF COMPLAINT:   Chief Complaint  Patient presents with  . Weakness   Melena and the generalized weakness. REVIEW OF SYSTEMS:  Review of Systems  Constitutional: Positive for malaise/fatigue. Negative for chills and fever.  HENT: Negative for sore throat.   Eyes: Negative for blurred vision and double vision.  Respiratory: Negative for cough, hemoptysis, shortness of breath, wheezing and stridor.   Cardiovascular: Positive for leg swelling. Negative for chest pain, palpitations and orthopnea.  Gastrointestinal: Positive for melena. Negative for abdominal pain, blood in stool, diarrhea, nausea and vomiting.  Genitourinary: Negative for dysuria, flank pain and hematuria.  Musculoskeletal: Negative for back pain and joint pain.  Skin: Negative for rash.  Neurological: Positive for weakness. Negative for dizziness, sensory change, focal weakness, seizures, loss of consciousness and headaches.  Endo/Heme/Allergies: Negative for polydipsia.  Psychiatric/Behavioral: Negative for depression. The patient is not nervous/anxious.     DRUG ALLERGIES:  No Known Allergies VITALS:  Blood pressure (!) 112/53, pulse 64, temperature 97.7 F (36.5 C), temperature source Oral, resp. rate 19, height 5\' 5"  (1.651 m), weight 177 lb 9.6 oz (80.6 kg), SpO2 97 %. PHYSICAL EXAMINATION:  Physical Exam  Constitutional: He is oriented to person, place, and time and well-developed, well-nourished, and in no distress.  HENT:  Head: Normocephalic.  Mouth/Throat: Oropharynx is clear and moist.  Eyes: Conjunctivae and EOM are normal. Pupils are equal, round, and reactive to light. No scleral icterus.  Neck: Normal range of motion. Neck supple. No JVD present. No tracheal deviation present.  Cardiovascular: Normal rate, regular rhythm and normal heart sounds.  Exam reveals no gallop.  No murmur heard. Pulmonary/Chest: Effort normal and breath sounds normal. No respiratory distress. He has no wheezes. He has no rales.  Abdominal: Soft. Bowel sounds are normal. He exhibits no distension. There is no tenderness. There is no rebound.  Musculoskeletal: Normal range of motion. He exhibits edema. He exhibits no tenderness.  Neurological: He is alert and oriented to person, place, and time. No cranial nerve deficit.  Skin: No rash noted. No erythema.  Psychiatric: Affect normal.   LABORATORY PANEL:  Male CBC Recent Labs  Lab 04/11/17 0050  WBC 5.6  HGB 10.1*  HCT 29.7*  PLT 105*   ------------------------------------------------------------------------------------------------------------------ Chemistries  Recent Labs  Lab 04/10/17 1223 04/11/17 0050  NA 134* 133*  K 5.4* 4.4  CL 100* 101  CO2 24 23  GLUCOSE 122* 104*  BUN 65* 67*  CREATININE 1.89* 1.75*  CALCIUM 8.3* 8.1*  AST 69*  --   ALT 25  --   ALKPHOS 51  --   BILITOT 0.9  --    RADIOLOGY:  Dg Elbow Complete Left  Result Date: 04/10/2017 CLINICAL DATA:  Patient slipped out of a chair yesterday and has bruising and skin tear is over the left elbow. EXAM: LEFT ELBOW - COMPLETE 3+ VIEW COMPARISON:  None in PACs FINDINGS: The bones are subjectively adequately mineralized. The radial head is intact. The olecranon and distal humerus are intact. There is no joint effusion. Mild soft tissue swelling posteriorly may reflect olecranon bursitis. IMPRESSION: There is no acute fracture nor dislocation of the left elbow. Electronically Signed   By: David  SwazilandJordan M.D.   On: 04/10/2017 14:28   ASSESSMENT AND PLAN:   Patient is a 83 year old white male with  multiple medical problems presenting with generalized weakness and GI bleed  1.  Upper GI bleed with melena Stopped Coumadin Continue Protonix IV and follow-up GI physician Also the ER physician has given patient vitamin K and is  transfusing him blood. Full liquids for now and possible EGD tomorrow.  Anemia due to acute blood loss.  Status post PRBC transfusion.  Hemoglobin is 10. Follow hemoglobin.  2.  Acute on chronic systolic CHF Continue IV Lasix Follow-up echocardiogram. Patient has a creatinine that is elevated needs to monitor this with IV Lasix  ARF.  Follow-up BMP while on Lasix. Hyperkalemia. Improved. Hyponatremia.  Follow-up BMP.  3.  Chronic atrial fibrillation Hold Coumadin Continue metoprolol  4.  Coronary artery disease. continue isosorbide mononitrate and metoprolol, hold if BP is low.  5.  Hyperlipidemia unspecified continue Mevacor  6.  Miscellaneous SCDs for DVT prophylaxis  Generalized weakness.  PT evaluation.  All the records are reviewed and case discussed with Care Management/Social Worker. Management plans discussed with the patient, family and they are in agreement.  CODE STATUS: Full Code  TOTAL TIME TAKING CARE OF THIS PATIENT: 38 minutes.   More than 50% of the time was spent in counseling/coordination of care: YES  POSSIBLE D/C IN 3 DAYS, DEPENDING ON CLINICAL CONDITION.   Shaune PollackQing Eldra Word M.D on 04/11/2017 at 2:11 PM  Between 7am to 6pm - Pager - [phone]  After 6pm go to [email or web address] - Therapist, nutritionalpassword EPAS [facility]  [facility] [place] [facility]

## 2017-04-11 NOTE — Progress Notes (Signed)
Advanced Care Plan.  Purpose of Encounter: CODE STATUS. Parties in Attendance: The patient and me. [name] Decisional Capacity: Yes. Medical Story: Kyle Rollins  is a 83 y.o. male with a known history of systolic CHF, atrial fibrillation on coumadin, coronary artery disease, diabetes type 2, hyperlipidemia, hypertension presenting with complaint of generalized weakness.  He has upper GI bleeding with melena and worsening anemia due to acute blood loss, acute on chronic systolic CHF and acute renal failure.  He is on Protonix IV and Lasix IV.  He also got blood transfusion.  I discussed with the patient about his current condition, his wish and CODE STATUS.  He expressed that he wants full code at this time.  Plan:  Code Status: Full code Time spent discussing advance care planning: 17 minutes.

## 2017-04-11 NOTE — Care Management (Signed)
Patient admitted from home with GI bleed.  PCP Babaoff.  Previous admission patient was set up with heart failure clinic, and [facility] with lasix protocol.  Confirmed with Jermaine at [facility] that patient is still open with RN, PT, and OT.  Patient has a RW and scales in the home.  PT consult pending. RNCM following.

## 2017-04-12 DIAGNOSIS — K921 Melena: Secondary | ICD-10-CM | POA: Diagnosis not present

## 2017-04-12 LAB — MAGNESIUM: MAGNESIUM: 1.9 mg/dL (ref 1.7–2.4)

## 2017-04-12 LAB — BASIC METABOLIC PANEL
Anion gap: 10 (ref 5–15)
BUN: 63 mg/dL — AB (ref 6–20)
CHLORIDE: 101 mmol/L (ref 101–111)
CO2: 22 mmol/L (ref 22–32)
Calcium: 7.9 mg/dL — ABNORMAL LOW (ref 8.9–10.3)
Creatinine, Ser: 1.8 mg/dL — ABNORMAL HIGH (ref 0.61–1.24)
GFR calc Af Amer: 37 mL/min — ABNORMAL LOW (ref >60)
GFR calc non Af Amer: 32 mL/min — ABNORMAL LOW (ref >60)
Glucose, Bld: 102 mg/dL — ABNORMAL HIGH (ref 65–99)
POTASSIUM: 3.4 mmol/L — AB (ref 3.5–5.1)
SODIUM: 133 mmol/L — AB (ref 135–145)

## 2017-04-12 LAB — PROTIME-INR
INR: 1.32
Prothrombin Time: 16.3 seconds — ABNORMAL HIGH (ref 11.4–15.2)

## 2017-04-12 LAB — HEMOGLOBIN
HEMOGLOBIN: 10.3 g/dL — AB (ref 13.0–18.0)
Hemoglobin: 10.1 g/dL — ABNORMAL LOW (ref 13.0–18.0)

## 2017-04-12 MED ORDER — DOCUSATE SODIUM 100 MG PO CAPS
100.0000 mg | ORAL_CAPSULE | Freq: Two times a day (BID) | ORAL | Status: DC
  Administered 2017-04-13 (×2): 100 mg via ORAL
  Filled 2017-04-12 (×3): qty 1

## 2017-04-12 MED ORDER — POTASSIUM CHLORIDE CRYS ER 20 MEQ PO TBCR
40.0000 meq | EXTENDED_RELEASE_TABLET | Freq: Once | ORAL | Status: AC
  Administered 2017-04-12: 40 meq via ORAL
  Filled 2017-04-12: qty 2

## 2017-04-12 MED ORDER — BISACODYL 5 MG PO TBEC: 5.0000 mg | DELAYED_RELEASE_TABLET | Freq: Every day | ORAL | Status: DC | PRN

## 2017-04-12 MED ORDER — PANTOPRAZOLE SODIUM 40 MG PO TBEC
40.0000 mg | DELAYED_RELEASE_TABLET | Freq: Every day | ORAL | Status: DC
  Administered 2017-04-12 – 2017-04-13 (×2): 40 mg via ORAL
  Filled 2017-04-12 (×2): qty 1

## 2017-04-12 MED ORDER — FUROSEMIDE 20 MG PO TABS
20.0000 mg | ORAL_TABLET | Freq: Once | ORAL | Status: AC
  Administered 2017-04-12: 20 mg via ORAL
  Filled 2017-04-12: qty 1

## 2017-04-12 NOTE — [place] FL2 (Signed)
[place] MEDICAID FL2 LEVEL OF CARE SCREENING TOOL     IDENTIFICATION  Patient Name: Kyle Rollins. Birthdate: October 21, 1927 Sex: male Admission Date (Current Location): 04/10/2017  [place]   Facility and Address:  [facility], [address], [place], [place] [address]      Provider Number: [id number]  Attending Physician Name and Address:  Shaune Pollackhen, Qing, MD  Relative Name and Phone Number:  Jodelle Redarham,Dashiel C Son   [phone] or Niel Hummerarham, Marie Spouse [phone]     Current Level of Care: Hospital Recommended Level of Care: Skilled Nursing Facility Prior Approval Number:    Date Approved/Denied:   PASRR Number: [id number] [id number]  Discharge Plan: SNF    Current Diagnoses: Patient Active Problem List   Diagnosis Date Noted  . GI bleed 04/10/2017  . Acute on chronic systolic heart failure (HCC) 03/03/2017  . AKI (acute kidney injury) (HCC) 03/03/2017    Orientation RESPIRATION BLADDER Height & Weight     Self, Time, Situation, Place  Normal Continent Weight: 177 lb 9.6 oz (80.6 kg) Height:  5\' 5"  (165.1 cm)  BEHAVIORAL SYMPTOMS/MOOD NEUROLOGICAL BOWEL NUTRITION STATUS      Continent Diet(Clear Liquid Diet)  AMBULATORY STATUS COMMUNICATION OF NEEDS Skin   Limited Assist Verbally Normal                       Personal Care Assistance Level of Assistance  Bathing, Feeding, Dressing Bathing Assistance: Limited assistance Feeding assistance: Independent Dressing Assistance: Limited assistance     Functional Limitations Info  Sight, Hearing, Speech Sight Info: Adequate Hearing Info: Adequate Speech Info: Adequate    SPECIAL CARE FACTORS FREQUENCY  PT (By licensed PT)     PT Frequency: 5x a week              Contractures Contractures Info: Not present    Additional Factors Info  Code Status, Allergies Code Status Info: Full Code Allergies Info: NKA           Current Medications (04/12/2017):   This is the current hospital active medication list Current Facility-Administered Medications  Medication Dose Route Frequency Provider Last Rate Last Dose  . 0.9 %  sodium chloride infusion  250 mL Intravenous PRN Auburn BilberryPatel, Shreyang, MD      . acetaminophen (TYLENOL) tablet 650 mg  650 mg Oral Q6H PRN Auburn BilberryPatel, Shreyang, MD       Or  . acetaminophen (TYLENOL) suppository 650 mg  650 mg Rectal Q6H PRN Auburn BilberryPatel, Shreyang, MD      . bisacodyl (DULCOLAX) EC tablet 5 mg  5 mg Oral Daily PRN Shaune Pollackhen, Qing, MD      . docusate sodium (COLACE) capsule 100 mg  100 mg Oral BID Shaune Pollackhen, Qing, MD      . HYDROcodone-acetaminophen (NORCO/VICODIN) 5-325 MG per tablet 1-2 tablet  1-2 tablet Oral Q4H PRN Auburn BilberryPatel, Shreyang, MD      . isosorbide dinitrate (ISORDIL) tablet 30 mg  30 mg Oral TID Shaune Pollackhen, Qing, MD   30 mg at 04/12/17 1516  . metoprolol tartrate (LOPRESSOR) tablet 12.5 mg  12.5 mg Oral BID Shaune Pollackhen, Qing, MD   12.5 mg at 04/12/17 1036  . nitroGLYCERIN (NITROSTAT) SL tablet 0.4 mg  0.4 mg Sublingual Q5 min PRN Auburn BilberryPatel, Shreyang, MD      . ondansetron [facility](ZOFRAN) tablet 4 mg  4 mg Oral Q6H PRN Auburn BilberryPatel, Shreyang, MD       Or  . ondansetron [facility](ZOFRAN)  injection 4 mg  4 mg Intravenous Q6H PRN Auburn BilberryPatel, Shreyang, MD      . pantoprazole (PROTONIX) EC tablet 40 mg  40 mg Oral Daily Shaune Pollackhen, Qing, MD   40 mg at 04/12/17 1040  . pravastatin (PRAVACHOL) tablet 20 mg  20 mg Oral q1800 Auburn BilberryPatel, Shreyang, MD   20 mg at 04/12/17 1705  . sodium chloride flush (NS) 0.9 % injection 3 mL  3 mL Intravenous Q12H Auburn BilberryPatel, Shreyang, MD   3 mL at 04/12/17 1035  . sodium chloride flush (NS) 0.9 % injection 3 mL  3 mL Intravenous PRN Auburn BilberryPatel, Shreyang, MD         Discharge Medications: Please see discharge summary for a list of discharge medications.  Relevant Imaging Results:  Relevant Lab Results:   Additional Information SSN [id number]  Darleene Cleavernterhaus, Humaira Sculley R, [place]

## 2017-04-12 NOTE — Clinical Social Work Note (Signed)
Clinical Social Work Assessment  Patient Details  Name: Kyle Balesrvin C Mizzell Sr. MRN: [id number] Date of Birth: 1928-02-09  Date of referral:  04/12/17               Reason for consult:  Facility Placement                Permission sought to share information with:  Facility Medical sales representativeContact Representative, Family Supports Permission granted to share information::  Yes, Verbal Permission Granted  Name::     Kyle Rollins,Kyle Rollins   [phone] or Kyle Hummerarham, Kyle Rollins [phone]   Agency::  SNF Admissions  Relationship::     Contact Information:     Housing/Transportation Living arrangements for the past 2 months:  Single Family Home Source of Information:  Patient, Rollins Patient Interpreter Needed:  None Criminal Activity/Legal Involvement Pertinent to Current Situation/Hospitalization:  No - Comment as needed Significant Relationships:  Adult Children, Rollins Lives with:  Rollins, Adult Children Do you feel safe going back to the place where you live?  No Need for family participation in patient care:  No (Coment)  Care giving concerns:  Patient and family feel that he needs some short term rehab before he is able to return back home.   Social Worker assessment / plan:  Patient is an 83 year old male who is married and lives with his wife, patient is currently active with [facility].  Patient is alert and oriented x4, patient states he has never been to rehab before.  CSW explained role of CSW and process of looking for SNF for short term rehab.  CSW explained what to expect at SNF and how insurance will pay for stay.  CSW discussed what the process is for discharging to SNF and what to expect after discharge from SNF.  Patient and his family did not express any other questions, and gave CSW permission to begin bed search [place], patient and family state they prefer [facility] [place].  Employment status:  Retired Database administratornsurance information:  Managed Medicare PT  Recommendations:  Skilled Nursing Facility Information / Referral to community resources:  Skilled Nursing Facility  Patient/Family's Response to care:  Patient and family are agreeable to going to SNF for short term rehab.  Patient/Family's Understanding of and Emotional Response to Diagnosis, Current Treatment, and Prognosis:  Patient and family are hopeful that [facility] will not [facility].  Emotional Assessment Appearance:  Appears younger than stated age Attitude/Demeanor/Rapport:    Affect (typically observed):  Appropriate, Calm, Stable, Pleasant Orientation:  Oriented to Self, Oriented to Place, Oriented to  Time, Oriented to Situation Alcohol / Substance use:  Not Applicable Psych involvement (Current and /or in the community):  No (Comment)  Discharge Needs  Concerns to be addressed:  Lack of Support, Care Coordination Readmission within the last 30 days:  No Current discharge risk:  Lack of support system Barriers to Discharge:  Continued Medical Work up   Darleene Cleavernterhaus, Laurisa Sahakian R, LCSWA 04/12/2017, 3:00pm

## 2017-04-12 NOTE — Evaluation (Signed)
Physical Therapy Evaluation Patient Details Name: Kyle Balesrvin C Karrer Sr. MRN: [id number] DOB: March 20, 1928 Today's Date: 04/12/2017   History of Present Illness  Pt is a 83 y/o M who presented with SOB, LE swelling, melena.  Pt admitted for upper GI bleed.  Pt's PMH includes a-fib, AAA, bradycardia, CHF, CABG.    Clinical Impression  Pt admitted with above diagnosis. Pt currently with functional limitations due to the deficits listed below (see PT Problem List). Kyle Rollins presents with generalized weakness BUE/BLE and requires mod assist for bed mobility and transfers.  He fatigues quickly limiting his ambulatory distance to 3 ft which he was able to perform x2.  Given pt's current mobility status, recommending SNF at d/c.  Pt will benefit from skilled PT to increase their independence and safety with mobility to allow discharge to the venue listed below.      Follow Up Recommendations SNF    Equipment Recommendations  None recommended by PT    Recommendations for Other Services       Precautions / Restrictions Precautions Precautions: Fall Restrictions Weight Bearing Restrictions: No      Mobility  Bed Mobility Overal bed mobility: Needs Assistance Bed Mobility: Supine to Sit     Supine to sit: Mod assist;HOB elevated     General bed mobility comments: Assist to elevate trunk and use of bed pad to assist pt in scooting to EOB.  Pt relies heavily on use of bed rail to pull into sitting.   Transfers Overall transfer level: Needs assistance Equipment used: Rolling walker (2 wheeled) Transfers: Sit to/from Stand Sit to Stand: Mod assist         General transfer comment: Assist to boost to standing after cues for proper hand placement.  Assist to control descent to sit.  Pt performed sit>stand x1 from bed and x1 from chair.   Ambulation/Gait Ambulation/Gait assistance: Min assist Ambulation Distance (Feet): 6 Feet(3,3) Assistive device: Rolling walker (2 wheeled) Gait  Pattern/deviations: Decreased stride length;Antalgic;Trunk flexed Gait velocity: decreased Gait velocity interpretation: Below normal speed for age/gender General Gait Details: Pt with flexed posture and fatigues quickly.  Only able to ambulate up to 3 ft at a time and after second bout the pt declines attempting further.  Assist to remain steady while ambulating.   Stairs            Wheelchair Mobility    Modified Rankin (Stroke Patients Only)       Balance Overall balance assessment: Needs assistance;History of Falls Sitting-balance support: Feet supported;No upper extremity supported Sitting balance-Leahy Scale: Fair     Standing balance support: Bilateral upper extremity supported;During functional activity Standing balance-Leahy Scale: Poor Standing balance comment: Relies on UE support for static and dynamic activities                             Pertinent Vitals/Pain Pain Assessment: No/denies pain    Home Living Family/patient expects to be discharged to:: Private residence Living Arrangements: Spouse/significant other Available Help at Discharge: Family(wife unable to provide physical assist) Type of Home: House Home Access: Stairs to enter Entrance Stairs-Rails: Left Entrance Stairs-Number of Steps: 4 Home Layout: One level Home Equipment: Walker - 2 wheels;Grab bars - toilet;Other (comment);Toilet riser      Prior Function Level of Independence: Needs assistance   Gait / Transfers Assistance Needed: Pt ambulating household distances with some difficulty due to weakness.  Has been working with HHPT.  Reports  several falls in the past 6 months.    ADL's / Homemaking Assistance Needed: Wife assists with donning pants and with sponge bath.          Hand Dominance        Extremity/Trunk Assessment   Upper Extremity Assessment Upper Extremity Assessment: (BUE strength grossly 3+/5)    Lower Extremity Assessment Lower Extremity  Assessment: (BLE strength grossly 3/5)       Communication      Cognition Arousal/Alertness: Awake/alert Behavior During Therapy: WFL for tasks assessed/performed Overall Cognitive Status: Within Functional Limits for tasks assessed                                        General Comments General comments (skin integrity, edema, etc.): Wife present during Evaluation.     Exercises General Exercises - Lower Extremity Ankle Circles/Pumps: AROM;Both;10 reps;Supine Quad Sets: Strengthening;Both;10 reps;Supine Straight Leg Raises: AROM;Both;Strengthening;5 reps;Supine   Assessment/Plan    PT Assessment Patient needs continued PT services  PT Problem List Decreased strength;Decreased activity tolerance;Decreased balance;Decreased mobility;Decreased knowledge of use of DME;Decreased safety awareness       PT Treatment Interventions DME instruction;Gait training;Stair training;Functional mobility training;Therapeutic activities;Therapeutic exercise;Balance training;Neuromuscular re-education;Patient/family education;Wheelchair mobility training    PT Goals (Current goals can be found in the Care Plan section)  Acute Rehab PT Goals Patient Stated Goal: to get stronger PT Goal Formulation: With patient Time For Goal Achievement: 04/26/17 Potential to Achieve Goals: Good    Frequency Min 2X/week   Barriers to discharge Inaccessible home environment;Decreased caregiver support Steps to enter home and wife unable to provide physical assist    Co-evaluation               AM-PAC PT "6 Clicks" Daily Activity  Outcome Measure Difficulty turning over in bed (including adjusting bedclothes, sheets and blankets)?: Unable Difficulty moving from lying on back to sitting on the side of the bed? : Unable Difficulty sitting down on and standing up from a chair with arms (e.g., wheelchair, bedside commode, etc,.)?: Unable Help needed moving to and from a bed to chair  (including a wheelchair)?: A Little Help needed walking in hospital room?: A Lot Help needed climbing 3-5 steps with a railing? : Total 6 Click Score: 9    End of Session Equipment Utilized During Treatment: Gait belt Activity Tolerance: Patient limited by fatigue Patient left: in chair;with call bell/phone within reach;with chair alarm set;with family/visitor present Nurse Communication: Mobility status PT Visit Diagnosis: Muscle weakness (generalized) (M62.81);History of falling (Z91.81);Unsteadiness on feet (R26.81);Other abnormalities of gait and mobility (R26.89)    Time: [id number] PT Time Calculation (min) (ACUTE ONLY): 26 min   Charges:   PT Evaluation $PT Eval Low Complexity: 1 Low PT Treatments $Therapeutic Activity: 8-22 mins   PT G Codes:   PT G-Codes **NOT FOR INPATIENT CLASS** Functional Assessment Tool Used: AM-PAC 6 Clicks Basic Mobility;Clinical judgement Functional Limitation: Mobility: Walking and moving around Mobility: Walking and Moving Around Current Status ([id number](G8978): At least 60 percent but less than 80 percent impaired, limited or restricted Mobility: Walking and Moving Around Goal Status [phone](G8979): At least 20 percent but less than 40 percent impaired, limited or restricted   Encarnacion ChuAshley Abashian PT, DPT 04/12/2017, 2:27 PM

## 2017-04-12 NOTE — Progress Notes (Signed)
Notified Dr. Anne HahnWillis about patient BP of 107/50 and it's okay  for nurse to hold Lopressor and  Isordil.

## 2017-04-12 NOTE — Clinical Social Work Note (Signed)
CSW met with patient and his family and they have agreed that patient needs some short term rehab before he is able to return back home.  CSW was given permission to begin bed search in [place].  Jones Broom. Norval Morton, MSW, [place]  04/12/2017 6:57 PM

## 2017-04-12 NOTE — Progress Notes (Signed)
[facility] [place] [facility]   PATIENT NAME: Kyle Rollins    MR#:  [id number]  DATE OF BIRTH:  05-Jan-1928  SUBJECTIVE:  CHIEF COMPLAINT:   Chief Complaint  Patient presents with  . Weakness   No melena, but has generalized weakness. REVIEW OF SYSTEMS:  Review of Systems  Constitutional: Positive for malaise/fatigue. Negative for chills and fever.  HENT: Negative for sore throat.   Eyes: Negative for blurred vision and double vision.  Respiratory: Negative for cough, hemoptysis, shortness of breath, wheezing and stridor.   Cardiovascular: Positive for leg swelling. Negative for chest pain, palpitations and orthopnea.  Gastrointestinal: Negative for abdominal pain, blood in stool, diarrhea, melena, nausea and vomiting.  Genitourinary: Negative for dysuria, flank pain and hematuria.  Musculoskeletal: Negative for back pain and joint pain.  Skin: Negative for rash.  Neurological: Positive for weakness. Negative for dizziness, sensory change, focal weakness, seizures, loss of consciousness and headaches.  Endo/Heme/Allergies: Negative for polydipsia.  Psychiatric/Behavioral: Negative for depression. The patient is not nervous/anxious.     DRUG ALLERGIES:  No Known Allergies VITALS:  Blood pressure (!) 106/54, pulse 62, temperature 98.5 F (36.9 C), temperature source Oral, resp. rate 16, height 5\' 5"  (1.651 m), weight 177 lb 9.6 oz (80.6 kg), SpO2 95 %. PHYSICAL EXAMINATION:  Physical Exam  Constitutional: He is oriented to person, place, and time and well-developed, well-nourished, and in no distress.  HENT:  Head: Normocephalic.  Mouth/Throat: Oropharynx is clear and moist.  Eyes: Conjunctivae and EOM are normal. Pupils are equal, round, and reactive to light. No scleral icterus.  Neck: Normal range of motion. Neck supple. No JVD present. No tracheal deviation present.  Cardiovascular: Normal rate, regular rhythm and normal heart sounds. Exam  reveals no gallop.  No murmur heard. Pulmonary/Chest: Effort normal and breath sounds normal. No respiratory distress. He has no wheezes. He has no rales.  Abdominal: Soft. Bowel sounds are normal. He exhibits no distension. There is no tenderness. There is no rebound.  Musculoskeletal: Normal range of motion. He exhibits edema. He exhibits no tenderness.  Neurological: He is alert and oriented to person, place, and time. No cranial nerve deficit.  Skin: No rash noted. No erythema.  Psychiatric: Affect normal.   LABORATORY PANEL:  Male CBC Recent Labs  Lab 04/11/17 0050  04/12/17 0705  WBC 5.6  --   --   HGB 10.1*   < > 10.1*  HCT 29.7*  --   --   PLT 105*  --   --    < > = values in this interval not displayed.   ------------------------------------------------------------------------------------------------------------------ Chemistries  Recent Labs  Lab 04/10/17 1223  04/12/17 0705  NA 134*   < > 133*  K 5.4*   < > 3.4*  CL 100*   < > 101  CO2 24   < > 22  GLUCOSE 122*   < > 102*  BUN 65*   < > 63*  CREATININE 1.89*   < > 1.80*  CALCIUM 8.3*   < > 7.9*  MG  --   --  1.9  AST 69*  --   --   ALT 25  --   --   ALKPHOS 51  --   --   BILITOT 0.9  --   --    < > = values in this interval not displayed.   RADIOLOGY:  [place] Abdomen Complete  Result Date: 04/11/2017 CLINICAL DATA:  Thrombocytopenia. EXAM: ABDOMEN  ULTRASOUND COMPLETE COMPARISON:  CT abdomen and pelvis 10/01/2009 and abdominal ultrasound 09/07/2009 FINDINGS: Examination was technically limited by a abdominal edema and ascites. Gallbladder: No gallstones or wall thickening visualized. No sonographic Murphy sign noted by sonographer. Common bile duct: Diameter: 5 mm Liver: Nodular contour with heterogeneous, mildly echogenic echotexture. No focal lesion identified. Portal vein is patent on color Doppler imaging with normal direction of blood flow towards the liver. IVC: No abnormality visualized. Pancreas: Not well  visualized. Spleen: Size and appearance within normal limits. Right Kidney: Length: 10.0 cm. Increased echogenicity. No mass or hydronephrosis visualized. Left Kidney: Length: 6.9 cm. Increased echogenicity. Lower pole scarring on prior CT is partially obscured by artifact from bowel gas. No mass or hydronephrosis visualized. Abdominal aorta: No evidence of proximal abdominal aortic aneurysm. Mid and distal abdominal aorta were obscured. Other findings: Moderate volume ascites. IMPRESSION: 1. Cirrhosis with moderate volume ascites. 2. Echogenic kidneys compatible with medical renal disease. Asymmetric left renal atrophy/scarring. No hydronephrosis. 3. Normal appearance of the spleen. Electronically Signed   By: Sebastian AcheAllen  Grady M.D.   On: 04/11/2017 16:08   ASSESSMENT AND PLAN:   Patient is a 83 year old white male with multiple medical problems presenting with generalized weakness and GI bleed  1.  Upper GI bleed with melena Stopped Coumadin Discontinue Protonix IV and change to po per Dr. Allegra LaiVanga. Also the ER physician has given patient vitamin K and s/p PRBC tranfusion. There is no indication for EGD at this time as pt is not actively bleeding per Dr. Allegra LaiVanga.  Anemia due to acute blood loss.  Status post PRBC transfusion.  Hemoglobin is 10. Follow hemoglobin.  2.  Acute on chronic systolic CHF Continue IV Lasix Follow-up echocardiogram and cardiology consult. Patient has worsening elevated creatinine, needs to monitor this with IV Lasix  ARF.  Follow-up BMP while on Lasix. Hyperkalemia. Improved. Hypokalemia, given potassium, follow-up level. Hyponatremia.  Follow-up BMP.  3.  Chronic atrial fibrillation Hold Coumadin Continue metoprolol  4.  Coronary artery disease. continue isosorbide mononitrate and metoprolol, hold if BP is low.  5.  Hyperlipidemia unspecified continue Mevacor  6.  Miscellaneous SCDs for DVT prophylaxis  Generalized weakness.  PT evaluation: SNF.  All the  records are reviewed and case discussed with Care Management/Social Worker. Management plans discussed with the patient, family and they are in agreement.  CODE STATUS: Full Code  TOTAL TIME TAKING CARE OF THIS PATIENT: 39 minutes.   More than 50% of the time was spent in counseling/coordination of care: YES  POSSIBLE D/C IN 2 DAYS, DEPENDING ON CLINICAL CONDITION.   Kyle Rollins M.D on 04/12/2017 at 3:20 PM  Between 7am to 6pm - Pager - [phone]  After 6pm go to [email or web address] - Therapist, nutritionalpassword EPAS [facility]  [facility] [place] [facility]

## 2017-04-13 ENCOUNTER — Inpatient Hospital Stay
Admit: 2017-04-13 | Discharge: 2017-04-13 | Disposition: A | Discharge status: Home / Self Care | Payer: Medicare Other | Attending: Internal Medicine | Admitting: Internal Medicine

## 2017-04-13 ENCOUNTER — Inpatient Hospital Stay: Payer: Medicare Other

## 2017-04-13 DIAGNOSIS — K921 Melena: Secondary | ICD-10-CM | POA: Diagnosis not present

## 2017-04-13 LAB — BPAM RBC
BLOOD PRODUCT EXPIRATION DATE: 201812242359
Blood Product Expiration Date: 201812242359
Blood Product Expiration Date: 201812252359
ISSUE DATE / TIME: 201812031843
UNIT TYPE AND RH: 5100
UNIT TYPE AND RH: 5100
Unit Type and Rh: 5100

## 2017-04-13 LAB — GRAM STAIN

## 2017-04-13 LAB — BASIC METABOLIC PANEL
Anion gap: 8 (ref 5–15)
BUN: 61 mg/dL — AB (ref 6–20)
CHLORIDE: 100 mmol/L — AB (ref 101–111)
CO2: 23 mmol/L (ref 22–32)
CREATININE: 1.65 mg/dL — AB (ref 0.61–1.24)
Calcium: 7.8 mg/dL — ABNORMAL LOW (ref 8.9–10.3)
GFR calc Af Amer: 41 mL/min — ABNORMAL LOW (ref >60)
GFR calc non Af Amer: 36 mL/min — ABNORMAL LOW (ref >60)
Glucose, Bld: 110 mg/dL — ABNORMAL HIGH (ref 65–99)
Potassium: 3.4 mmol/L — ABNORMAL LOW (ref 3.5–5.1)
Sodium: 131 mmol/L — ABNORMAL LOW (ref 135–145)

## 2017-04-13 LAB — ECHOCARDIOGRAM COMPLETE
Height: 65 in
Weight: 2841.6 oz

## 2017-04-13 LAB — TYPE AND SCREEN
ABO/RH(D): O POS
Antibody Screen: POSITIVE
Donor AG Type: NEGATIVE
UNIT DIVISION: 0
Unit division: 0
Unit division: 0

## 2017-04-13 LAB — PROTEIN, PLEURAL OR PERITONEAL FLUID

## 2017-04-13 LAB — PROTIME-INR
INR: 1.33
Prothrombin Time: 16.4 seconds — ABNORMAL HIGH (ref 11.4–15.2)

## 2017-04-13 LAB — GLUCOSE, PLEURAL OR PERITONEAL FLUID: GLUCOSE FL: 133 mg/dL

## 2017-04-13 LAB — ALBUMIN, PLEURAL OR PERITONEAL FLUID

## 2017-04-13 LAB — BODY FLUID CELL COUNT WITH DIFFERENTIAL
EOS FL: 0 %
LYMPHS FL: 31 %
MONOCYTE-MACROPHAGE-SEROUS FLUID: 34 %
NEUTROPHIL FLUID: 35 %
Other Cells, Fluid: 0 %
WBC FLUID: 38 uL

## 2017-04-13 LAB — LACTATE DEHYDROGENASE, PLEURAL OR PERITONEAL FLUID: LD, Fluid: 27 U/L — ABNORMAL HIGH (ref 3–23)

## 2017-04-13 MED ORDER — PANTOPRAZOLE SODIUM 40 MG PO TBEC: 40.0000 mg | DELAYED_RELEASE_TABLET | Freq: Every day | ORAL | Status: AC

## 2017-04-13 MED ORDER — FUROSEMIDE 20 MG PO TABS
20.0000 mg | ORAL_TABLET | Freq: Every day | ORAL | Status: DC
  Administered 2017-04-13: 20 mg via ORAL
  Filled 2017-04-13: qty 1

## 2017-04-13 MED ORDER — FUROSEMIDE 20 MG PO TABS: 20.0000 mg | ORAL_TABLET | Freq: Every day | ORAL | Status: AC

## 2017-04-13 MED ORDER — METOPROLOL TARTRATE 25 MG PO TABS: 12.5000 mg | ORAL_TABLET | Freq: Two times a day (BID) | ORAL | Status: AC

## 2017-04-13 MED ORDER — POTASSIUM CHLORIDE CRYS ER 20 MEQ PO TBCR
40.0000 meq | EXTENDED_RELEASE_TABLET | Freq: Once | ORAL | Status: AC
  Administered 2017-04-13: 40 meq via ORAL
  Filled 2017-04-13: qty 2

## 2017-04-13 MED ORDER — BISACODYL 5 MG PO TBEC: 5.0000 mg | DELAYED_RELEASE_TABLET | Freq: Every day | ORAL | 0 refills | Status: AC | PRN

## 2017-04-13 NOTE — Clinical Social Work Note (Signed)
CSW presented bed offers to patient and his family, they chose [facility].  CSW contacted [facility] and they can accept patient once he is medically ready for discharge and orders have been received.  CSW updated unit social worker who will continue to follow patient's progress throughout discharge planning.  Ervin KnackEric R. Yusuke Beza, MSW, Theresia MajorsLCSWA [phone]  04/13/2017 10:06 AM

## 2017-04-13 NOTE — Discharge Instructions (Signed)
Heart healthy diet. Fall precaution.  Heart Failure Clinic appointment on April 21 2017 at 12:00pm with Kyle Kindredina Yosgar Demirjian, FNP. Please call [phone] to reschedule.

## 2017-04-13 NOTE — Progress Notes (Signed)
*  PRELIMINARY RESULTS* Echocardiogram 2D Echocardiogram has been performed.  Kyle Rollins, Kyle Rollins 04/13/2017, 8:42 AM

## 2017-04-13 NOTE — Plan of Care (Signed)
Pt is progressing. Pt went for ultrasound and paracentesis today.

## 2017-04-13 NOTE — Progress Notes (Signed)
[facility] - [place] [facility]   PATIENT NAME: Kyle Rollins    MR#:  [id number]  DATE OF BIRTH:  [birth year]  SUBJECTIVE:  CHIEF COMPLAINT:   Chief Complaint  Patient presents with  . Weakness   No melena, but still generalized weakness. REVIEW OF SYSTEMS:  Review of Systems  Constitutional: Positive for malaise/fatigue. Negative for chills and fever.  HENT: Negative for sore throat.   Eyes: Negative for blurred vision and double vision.  Respiratory: Negative for cough, hemoptysis, shortness of breath, wheezing and stridor.   Cardiovascular: Positive for leg swelling. Negative for chest pain, palpitations and orthopnea.  Gastrointestinal: Negative for abdominal pain, blood in stool, diarrhea, melena, nausea and vomiting.  Genitourinary: Negative for dysuria, flank pain and hematuria.  Musculoskeletal: Negative for back pain and joint pain.  Skin: Negative for rash.  Neurological: Positive for weakness. Negative for dizziness, sensory change, focal weakness, seizures, loss of consciousness and headaches.  Endo/Heme/Allergies: Negative for polydipsia.  Psychiatric/Behavioral: Negative for depression. The patient is not nervous/anxious.     DRUG ALLERGIES:  No Known Allergies VITALS:  Blood pressure (!) 116/56, pulse 62, temperature 98 F (36.7 C), resp. rate 16, height 5\' 5"  (1.651 m), weight 177 lb 9.6 oz (80.6 kg), SpO2 94 %. PHYSICAL EXAMINATION:  Physical Exam  Constitutional: He is oriented to person, place, and time and well-developed, well-nourished, and in no distress.  HENT:  Head: Normocephalic.  Mouth/Throat: Oropharynx is clear and moist.  Eyes: Conjunctivae and EOM are normal. Pupils are equal, round, and reactive to light. No scleral icterus.  Neck: Normal range of motion. Neck supple. No JVD present. No tracheal deviation present.  Cardiovascular: Normal rate, regular rhythm and normal heart sounds. Exam reveals no gallop.  No murmur  heard. Pulmonary/Chest: Effort normal and breath sounds normal. No respiratory distress. He has no wheezes. He has no rales.  Abdominal: Soft. Bowel sounds are normal. He exhibits no distension. There is no tenderness. There is no rebound.  Musculoskeletal: Normal range of motion. He exhibits edema. He exhibits no tenderness.  Neurological: He is alert and oriented to person, place, and time. No cranial nerve deficit.  Skin: No rash noted. No erythema.  Psychiatric: Affect normal.   LABORATORY PANEL:  Male CBC Recent Labs  Lab 04/11/17 0050  04/12/17 0705  WBC 5.6  --   --   HGB 10.1*   < > 10.1*  HCT 29.7*  --   --   PLT 105*  --   --    < > = values in this interval not displayed.   ------------------------------------------------------------------------------------------------------------------ Chemistries  Recent Labs  Lab 04/10/17 1223  04/12/17 0705 04/13/17 0355  NA 134*   < > 133* 131*  K 5.4*   < > 3.4* 3.4*  CL 100*   < > 101 100*  CO2 24   < > 22 23  GLUCOSE 122*   < > 102* 110*  BUN 65*   < > 63* 61*  CREATININE 1.89*   < > 1.80* 1.65*  CALCIUM 8.3*   < > 7.9* 7.8*  MG  --   --  1.9  --   AST 69*  --   --   --   ALT 25  --   --   --   ALKPHOS 51  --   --   --   BILITOT 0.9  --   --   --    < > = values  in this interval not displayed.   RADIOLOGY:  No results found. ASSESSMENT AND PLAN:   Patient is a 83 year old white male with multiple medical problems presenting with generalized weakness and GI bleed  1.  Upper GI bleed with melena Stopped Coumadin Discontinued Protonix IV and changed to po per Dr. Allegra LaiVanga. Also the ER physician has given patient vitamin K and s/p PRBC tranfusion. There is no indication for EGD at this time as pt is not actively bleeding per Dr. Allegra LaiVanga.  Anemia due to acute blood loss.  Status post PRBC transfusion.  Hemoglobin is 10. Follow hemoglobin.  2.  Acute on chronic systolic CHF He was on IV Lasix, changed to 20 mg po  daily due to low BP. Follow-up echocardiogram and cardiology consult. Patient has worsening elevated creatinine, needs to monitor this with IV Lasix  ARF.  Follow-up BMP while on Lasix. Hyperkalemia. Improved. Hypokalemia, given potassium, follow-up level. Hyponatremia.  Follow-up BMP.  3.  Chronic atrial fibrillation Hold Coumadin, Continuation of metoprolol for heart rate control per Dr. Gwen PoundsKowalski.  4.  Coronary artery disease.  Discontinue isosorbide mononitrate and continue metoprolol, hold if BP is low.  5.  Hyperlipidemia unspecified continue Mevacor  6.  Miscellaneous SCDs for DVT prophylaxis  Liver cirrhosis with moderate ascites.  Paracentesis today. Hyponatremia.  Follow-up sodium level while on lasix.  Generalized weakness.  PT evaluation: SNF.  All the records are reviewed and case discussed with Care Management/Social Worker. Management plans discussed with the patient, his wife and they are in agreement.  CODE STATUS: Full Code  TOTAL TIME TAKING CARE OF THIS PATIENT: 33 minutes.   More than 50% of the time was spent in counseling/coordination of care: YES  POSSIBLE D/C IN 2 DAYS, DEPENDING ON CLINICAL CONDITION.   Shaune PollackQing Dion Sibal M.D on 04/13/2017 at 3:16 PM  Between 7am to 6pm - Pager - [phone]  After 6pm go to [email or web address] - Therapist, nutritionalpassword EPAS [facility]  [facility] [place] [facility]

## 2017-04-13 NOTE — Consult Note (Signed)
[facility]  Patient ID: Kyle Shorterrvin C Bonnie Sr., MRN: [id number], DOB/AGE: Jun 15, 1927 83 y.o. Admit date: 04/10/2017   Date of Consult: 04/13/2017 Primary Physician: Kandyce RudBabaoff, Marcus, MD Primary Cardiologist: Higinio RogerParrish O's  Chief Complaint:  Chief Complaint  Patient presents with  . Weakness   Reason for Consult: Atrial fibrillation with GI bleed  HPI: 83 y.o. male with known chronic nonvalvular atrial fibrillation with symptomatic bradycardia status post previous pacemaker placement essential hypertension mixed hyperlipidemia diabetes and known coronary artery disease with peripheral vascular disease who has had an acute GI bleed for which the patient has had significant worsening shortness of breath.  It appears that the patient had pulmonary edema and congestive heart failure symptoms which have significantly improved after use of furosemide.  The patient is improved symptomatically with less edema and less cough and congestion is lying flat at this time.  The patient does have a hemoglobin from 10 after discontinuation of warfarin.  Otherwise heart rate has been controlled with his pacemaker and blood pressure is continuing to be controlled with low-dose beta-blocker.  There is been no evidence of myocardial infarction at this time  Past Medical History:  Diagnosis Date  . A-fib (HCC)   . AAA (abdominal aortic aneurysm) (HCC)   . Bradycardia   . CAD (coronary artery disease)   . CHF (congestive heart failure) (HCC)   . DM (diabetes mellitus) (HCC)   . Hyperlipidemia   . Hypertension   . Pancreatitis       Surgical History:  Past Surgical History:  Procedure Laterality Date  . CORONARY ARTERY BYPASS GRAFT     x2  . ENDOVASCULAR REPAIR/STENT GRAFT       Home Meds: Prior to Admission medications   Medication Sig Start Date End Date Taking? Authorizing Provider  furosemide (LASIX) 40 MG tablet Take 1 tablet (40 mg total) by mouth daily. 03/28/17  Yes  Minna AntisPaduchowski, Kevin, MD  isosorbide dinitrate (ISORDIL) 30 MG tablet Take 1 tablet by mouth 3 (three) times daily. 02/01/17  Yes [provider]  KLOR-CON M20 20 MEQ tablet Take 1 tablet by mouth 2 (two) times daily. 03/13/17  Yes [provider]  lovastatin (MEVACOR) 40 MG tablet Take 2 tablets by mouth daily.  02/07/17  Yes [provider]  metoprolol tartrate (LOPRESSOR) 25 MG tablet Take 1 tablet by mouth 2 (two) times daily. 02/13/17  Yes [provider]  warfarin (COUMADIN) 2 MG tablet Take 0.5-1 tablets by mouth daily. Take 1 tablet by mouth on Tuesday, Thursday, Friday, Saturday and Sunday. Take  tablet by mouth on Monday and Wednesday 02/24/17  Yes [provider]  bisacodyl (DULCOLAX) 5 MG EC tablet Take 1 tablet (5 mg total) by mouth daily as needed for moderate constipation. 04/13/17   Shaune Pollackhen, Qing, MD  furosemide (LASIX) 20 MG tablet Take 1 tablet (20 mg total) by mouth daily. 04/13/17   Shaune Pollackhen, Qing, MD  metoprolol tartrate (LOPRESSOR) 25 MG tablet Take 0.5 tablets (12.5 mg total) by mouth 2 (two) times daily. 04/13/17   Shaune Pollackhen, Qing, MD  nitroGLYCERIN (NITROSTAT) 0.4 MG SL tablet Place 1 tablet under the tongue. Every 5 minutes as needed for chest pain. May take up to 3 doses. 09/30/16 09/30/17  [provider]  pantoprazole (PROTONIX) 40 MG tablet Take 1 tablet (40 mg total) by mouth daily. 04/13/17   Shaune Pollackhen, Qing, MD    Inpatient Medications:  . docusate sodium  100 mg Oral BID  . furosemide  20 mg Oral Daily  . metoprolol tartrate  12.5 mg Oral BID  . pantoprazole  40 mg Oral Daily  . pravastatin  20 mg Oral q1800  . sodium chloride flush  3 mL Intravenous Q12H   . sodium chloride      Allergies: No Known Allergies  Social History   Socioeconomic History  . Marital status: Married    Spouse name: Not on file  . Number of children: Not on file  . Years of education: Not on file  . Highest education level: Not on file  Social Needs   . Financial resource strain: Not on file  . Food insecurity - worry: Not on file  . Food insecurity - inability: Not on file  . Transportation needs - medical: Not on file  . Transportation needs - non-medical: Not on file  Occupational History  . Not on file  Tobacco Use  . Smoking status: Former Smoker    Types: Cigarettes  . Smokeless tobacco: Former Neurosurgeon    Types: Snuff  Substance and Sexual Activity  . Alcohol use: No  . Drug use: No  . Sexual activity: Not on file  Other Topics Concern  . Not on file  Social History Narrative  . Not on file     Family History  Problem Relation Age of Onset  . CAD Father      Review of Systems Positive for bleeding shortness of breath weakness fatigue Negative for: General:  chills, fever, night sweats or weight changes.  Cardiovascular: PND orthopnea syncope dizziness  Dermatological skin lesions rashes Respiratory: Cough congestion Urologic: Frequent urination urination at night and hematuria Abdominal: negative for nausea, vomiting, diarrhea,  or hematemesis Neurologic: negative for visual changes, and/or hearing changes  All other systems reviewed and are otherwise negative except as noted above.  Labs: No results for input(s): CKTOTAL, CKMB, TROPONINI in the last 72 hours. Lab Results  Component Value Date   WBC 5.6 04/11/2017   HGB 10.1 (L) 04/12/2017   HCT 29.7 (L) 04/11/2017   MCV 105.2 (H) 04/11/2017   PLT 105 (L) 04/11/2017    Recent Labs  Lab 04/10/17 1223  04/13/17 0355  NA 134*   < > 131*  K 5.4*   < > 3.4*  CL 100*   < > 100*  CO2 24   < > 23  BUN 65*   < > 61*  CREATININE 1.89*   < > 1.65*  CALCIUM 8.3*   < > 7.8*  PROT 6.8  --   --   BILITOT 0.9  --   --   ALKPHOS 51  --   --   ALT 25  --   --   AST 69*  --   --   GLUCOSE 122*   < > 110*   < > = values in this interval not displayed.   No results found for: CHOL, HDL, LDLCALC, TRIG No results found for: DDIMER  Radiology/Studies:  Dg Chest  2 View  Result Date: 04/10/2017 CLINICAL DATA:  Weakness, fatigue, blood in stool BILATERAL leg edema and pain, on Coumadin, slipped out of chair yesterday, history CHF, CABG EXAM: CHEST  2 VIEW COMPARISON:  03/09/2017 FINDINGS: LEFT subclavian pacemaker lead projects at RIGHT ventricle unchanged. Upper normal size of cardiac silhouette post CABG. Atherosclerotic calcification aorta. Decreased lung volumes with scattered atelectasis LEFT base and RIGHT mid lung. No definite infiltrate, pleural effusion or pneumothorax. Mild central peribronchial thickening. Bones demineralized. IMPRESSION: Post  CABG and pacemaker. Bronchitic changes with scattered atelectasis in both lungs. Electronically Signed   By: Ulyses Southward M.D.   On: 04/10/2017 13:03   Dg Elbow Complete Left  Result Date: 04/10/2017 CLINICAL DATA:  Patient slipped out of a chair yesterday and has bruising and skin tear is over the left elbow. EXAM: LEFT ELBOW - COMPLETE 3+ VIEW COMPARISON:  None in PACs FINDINGS: The bones are subjectively adequately mineralized. The radial head is intact. The olecranon and distal humerus are intact. There is no joint effusion. Mild soft tissue swelling posteriorly may reflect olecranon bursitis. IMPRESSION: There is no acute fracture nor dislocation of the left elbow. Electronically Signed   By: David  [place] M.D.   On: 04/10/2017 14:28   Ct Head Wo Contrast  Result Date: 04/10/2017 CLINICAL DATA:  Progressive weakness and fatigue over the past few weeks. EXAM: CT HEAD WITHOUT CONTRAST TECHNIQUE: Contiguous axial images were obtained from the base of the skull through the vertex without intravenous contrast. COMPARISON:  None. FINDINGS: Brain: There is some cortical atrophy. No evidence of acute abnormality including hemorrhage, infarct, mass lesion, mass effect, midline shift or abnormal extra-axial fluid collection. No hydrocephalus or pneumocephalus. Vascular: Atherosclerosis noted. Skull: Intact.  Sinuses/Orbits: Negative. Other: None. IMPRESSION: No acute abnormality. Mild atrophy. Atherosclerosis. Electronically Signed   By: Drusilla Kanner M.D.   On: 04/10/2017 13:07   US Abdomen Complete  Result Date: 04/11/2017 CLINICAL DATA:  Thrombocytopenia. EXAM: ABDOMEN ULTRASOUND COMPLETE COMPARISON:  CT abdomen and pelvis 10/01/2009 and abdominal ultrasound 09/07/2009 FINDINGS: Examination was technically limited by a abdominal edema and ascites. Gallbladder: No gallstones or wall thickening visualized. No sonographic Murphy sign noted by sonographer. Common bile duct: Diameter: 5 mm Liver: Nodular contour with heterogeneous, mildly echogenic echotexture. No focal lesion identified. Portal vein is patent on color Doppler imaging with normal direction of blood flow towards the liver. IVC: No abnormality visualized. Pancreas: Not well visualized. Spleen: Size and appearance within normal limits. Right Kidney: Length: 10.0 cm. Increased echogenicity. No mass or hydronephrosis visualized. Left Kidney: Length: 6.9 cm. Increased echogenicity. Lower pole scarring on prior CT is partially obscured by artifact from bowel gas. No mass or hydronephrosis visualized. Abdominal aorta: No evidence of proximal abdominal aortic aneurysm. Mid and distal abdominal aorta were obscured. Other findings: Moderate volume ascites. IMPRESSION: 1. Cirrhosis with moderate volume ascites. 2. Echogenic kidneys compatible with medical renal disease. Asymmetric left renal atrophy/scarring. No hydronephrosis. 3. Normal appearance of the spleen. Electronically Signed   By: Sebastian Ache M.D.   On: 04/11/2017 16:08    EKG: Atrial fibrillation with ventricular pacing  Weights: Filed Weights   04/10/17 1221 04/10/17 1803  Weight: 84.1 kg (185 lb 4.8 oz) 80.6 kg (177 lb 9.6 oz)     Physical Exam: Blood pressure (!) 104/55, pulse 63, temperature 97.9 F (36.6 C), temperature source Oral, resp. rate 18, height 5\' 5"  (1.651 m), weight  80.6 kg (177 lb 9.6 oz), SpO2 94 %. Body mass index is 29.55 kg/m. General: Well developed, well nourished, in no acute distress. Head eyes ears nose throat: Normocephalic, atraumatic, sclera non-icteric, no xanthomas, nares are without discharge. No apparent thyromegaly and/or mass  Lungs: Normal respiratory effort.  no wheezes, no rales, diffuse rhonchi.  Heart: RRR with normal S1 S2.  3+ left sternal border murmur gallop, no rub, PMI is normal size and placement, carotid upstroke normal without bruit, jugular venous pressure is normal Abdomen: Soft, non-tender, non-distended with normoactive bowel sounds. No hepatomegaly.  No rebound/guarding. No obvious abdominal masses. Abdominal aorta is normal size without bruit Extremities: Trace edema. no cyanosis, no clubbing, no ulcers  Peripheral : 2+ bilateral upper extremity pulses, 2+ bilateral femoral pulses, 2+ bilateral dorsal pedal pulse Neuro: Alert and oriented. No facial asymmetry. No focal deficit. Moves all extremities spontaneously. Musculoskeletal: Normal muscle tone without kyphosis Psych:  Responds to questions appropriately with a normal affect.    Assessment: 83 year old male with coronary artery disease essential hypertension mixed hyperlipidemia chronic nonvalvular atrial fibrillation status post pacemaker placement with a GI bleed and acute congestive heart failure with edema slightly improved without evidence of myocardial infarction  Plan: 1.  Abstinence of Coumadin due to GI bleed no further reinstatement after further evaluation of primary cause of GI bleed 2.  Appropriate treatment for cause of GI bleed including proton pump inhibitors 3.  Continuation of metoprolol for heart rate control 4.  Diuresis with Lasix for pulmonary edema congestive heart failure 5.  Echocardiogram for LV systolic dysfunction and adjustments of medication thereafter  Signed, Lamar BlinksBruce J Kowalski M.D. [facility] 04/13/2017,  12:45 PM

## 2017-04-13 NOTE — Care Management Important Message (Signed)
Important Message  Patient Details  Name: Kyle Balesrvin C Tedeschi Sr. MRN: [id number] Date of Birth: 05/17/1927   Medicare Important Message Given:  Yes    Chapman FitchBOWEN, Britanie Harshman T, RN 04/13/2017, 3:36 PM

## 2017-04-13 NOTE — Progress Notes (Signed)
Physical Therapy Treatment Patient Details Name: Kyle Balesrvin C Balistreri Sr. MRN: [id number] DOB: January 02, 1928 Today's Date: 04/13/2017    History of Present Illness Pt is a 83 y/o M who presented with SOB, LE swelling, melena.  Pt admitted for upper GI bleed.  Pt's PMH includes a-fib, AAA, bradycardia, CHF, CABG.    PT Comments    Pt is very fatigued and uncomfortable s/p paracentesis this afternoon. Bed mobility, transfers, and ambulation deferred on this date. He agrees to bed exercises and is able to complete with good motivation today. Will continue to progress mobility in future sessions. Pt will benefit from PT services to address deficits in strength, balance, and mobility in order to return to full function at home.     Follow Up Recommendations  SNF     Equipment Recommendations  None recommended by PT    Recommendations for Other Services       Precautions / Restrictions Precautions Precautions: Fall Restrictions Weight Bearing Restrictions: No    Mobility  Bed Mobility               General bed mobility comments: Deferred bed mobility, transfers, and ambulation at this time. Pt curently feeling unwell s/p paracentesis this afternoon prior to arrival of PT  Transfers                    Ambulation/Gait                 Stairs            Wheelchair Mobility    Modified Rankin (Stroke Patients Only)       Balance                                            Cognition Arousal/Alertness: Awake/alert Behavior During Therapy: Flat affect Overall Cognitive Status: Within Functional Limits for tasks assessed                                        Exercises General Exercises - Lower Extremity Ankle Circles/Pumps: AROM;Both;15 reps;Supine Quad Sets: Strengthening;Both;15 reps;Supine Gluteal Sets: Strengthening;Both;15 reps;Supine Short Arc Quad: Strengthening;Both;15 reps;Supine Heel Slides:  Strengthening;Both;15 reps;Supine Hip ABduction/ADduction: Strengthening;Both;15 reps;Supine Straight Leg Raises: Strengthening;Both;15 reps;Supine    General Comments        Pertinent Vitals/Pain Pain Assessment: No/denies pain    Home Living                      Prior Function            PT Goals (current goals can now be found in the care plan section) Acute Rehab PT Goals Patient Stated Goal: to get stronger PT Goal Formulation: With patient Time For Goal Achievement: 04/26/17 Potential to Achieve Goals: Good Progress towards PT goals: Progressing toward goals    Frequency    Min 2X/week      PT Plan Current plan remains appropriate    Co-evaluation              AM-PAC PT "6 Clicks" Daily Activity  Outcome Measure  Difficulty turning over in bed (including adjusting bedclothes, sheets and blankets)?: Unable Difficulty moving from lying on back to sitting on the side of the bed? : Unable Difficulty sitting down on  and standing up from a chair with arms (e.g., wheelchair, bedside commode, etc,.)?: Unable Help needed moving to and from a bed to chair (including a wheelchair)?: A Little Help needed walking in hospital room?: A Lot Help needed climbing 3-5 steps with a railing? : Total 6 Click Score: 9    End of Session Equipment Utilized During Treatment: Gait belt Activity Tolerance: Patient tolerated treatment well Patient left: in bed;with call bell/phone within reach;with bed alarm set;with family/visitor present   PT Visit Diagnosis: Muscle weakness (generalized) (M62.81);History of falling (Z91.81);Unsteadiness on feet (R26.81);Other abnormalities of gait and mobility (R26.89)     Time: 1646-1700 PT Time Calculation (min) (ACUTE ONLY): 14 min  Charges:  $Therapeutic Exercise: 8-22 mins                    G Codes:       Sharalyn InkJason D Huprich PT, DPT     Huprich,Jason 04/13/2017, 5:30 PM

## 2017-04-14 ENCOUNTER — Emergency Department
Admission: EM | Admit: 2017-04-14 | Discharge: 2017-04-15 | Disposition: A | Discharge status: Home / Self Care | Payer: Medicare Other | Attending: Emergency Medicine | Admitting: Emergency Medicine

## 2017-04-14 ENCOUNTER — Emergency Department: Payer: Medicare Other

## 2017-04-14 DIAGNOSIS — Z79899 Other long term (current) drug therapy: Secondary | ICD-10-CM | POA: Diagnosis not present

## 2017-04-14 DIAGNOSIS — I5022 Chronic systolic (congestive) heart failure: Secondary | ICD-10-CM | POA: Diagnosis not present

## 2017-04-14 DIAGNOSIS — Z87891 Personal history of nicotine dependence: Secondary | ICD-10-CM | POA: Insufficient documentation

## 2017-04-14 DIAGNOSIS — E119 Type 2 diabetes mellitus without complications: Secondary | ICD-10-CM | POA: Insufficient documentation

## 2017-04-14 DIAGNOSIS — K921 Melena: Secondary | ICD-10-CM | POA: Diagnosis not present

## 2017-04-14 DIAGNOSIS — R079 Chest pain, unspecified: Secondary | ICD-10-CM

## 2017-04-14 DIAGNOSIS — I110 Hypertensive heart disease with heart failure: Secondary | ICD-10-CM | POA: Diagnosis not present

## 2017-04-14 DIAGNOSIS — Z951 Presence of aortocoronary bypass graft: Secondary | ICD-10-CM | POA: Insufficient documentation

## 2017-04-14 DIAGNOSIS — Z7901 Long term (current) use of anticoagulants: Secondary | ICD-10-CM | POA: Insufficient documentation

## 2017-04-14 DIAGNOSIS — I2510 Atherosclerotic heart disease of native coronary artery without angina pectoris: Secondary | ICD-10-CM | POA: Insufficient documentation

## 2017-04-14 DIAGNOSIS — R0602 Shortness of breath: Secondary | ICD-10-CM | POA: Diagnosis present

## 2017-04-14 LAB — TROPONIN I
TROPONIN I: 0.04 ng/mL — AB (ref <0.03)
Troponin I: 0.04 ng/mL (ref <0.03)

## 2017-04-14 LAB — HEPATITIS PANEL, ACUTE
HCV Ab: <0.1 s/co ratio (ref 0.0–0.9)
HEP B S AG: NEGATIVE
Hep A IgM: NEGATIVE
Hep B C IgM: NEGATIVE

## 2017-04-14 LAB — BASIC METABOLIC PANEL
ANION GAP: 10 (ref 5–15)
Anion gap: 10 (ref 5–15)
BUN: 59 mg/dL — AB (ref 6–20)
BUN: 57 mg/dL — AB (ref 6–20)
CHLORIDE: 98 mmol/L — AB (ref 101–111)
CO2: 25 mmol/L (ref 22–32)
CO2: 24 mmol/L (ref 22–32)
CREATININE: 1.49 mg/dL — AB (ref 0.61–1.24)
Calcium: 7.6 mg/dL — ABNORMAL LOW (ref 8.9–10.3)
Calcium: 7.9 mg/dL — ABNORMAL LOW (ref 8.9–10.3)
Chloride: 100 mmol/L — ABNORMAL LOW (ref 101–111)
Creatinine, Ser: 1.46 mg/dL — ABNORMAL HIGH (ref 0.61–1.24)
GFR, EST AFRICAN AMERICAN: 47 mL/min — AB (ref >60)
GFR, EST AFRICAN AMERICAN: 48 mL/min — AB (ref >60)
GFR, EST NON AFRICAN AMERICAN: 40 mL/min — AB (ref >60)
GFR, EST NON AFRICAN AMERICAN: 41 mL/min — AB (ref >60)
Glucose, Bld: 130 mg/dL — ABNORMAL HIGH (ref 65–99)
Glucose, Bld: 123 mg/dL — ABNORMAL HIGH (ref 65–99)
POTASSIUM: 3.9 mmol/L (ref 3.5–5.1)
Potassium: 3.4 mmol/L — ABNORMAL LOW (ref 3.5–5.1)
SODIUM: 133 mmol/L — AB (ref 135–145)
SODIUM: 134 mmol/L — AB (ref 135–145)

## 2017-04-14 LAB — PROTIME-INR
INR: 1.43
INR: 1.35
Prothrombin Time: 17.3 seconds — ABNORMAL HIGH (ref 11.4–15.2)
Prothrombin Time: 16.6 seconds — ABNORMAL HIGH (ref 11.4–15.2)

## 2017-04-14 LAB — CYTOLOGY - NON PAP

## 2017-04-14 LAB — MISC LABCORP TEST (SEND OUT): LABCORP TEST CODE: 19588

## 2017-04-14 LAB — CBC WITH DIFFERENTIAL/PLATELET
BASOS ABS: 0 10*3/uL (ref 0–0.1)
BASOS PCT: 0 %
EOS PCT: 1 %
Eosinophils Absolute: 0.1 10*3/uL (ref 0–0.7)
HEMATOCRIT: 32.7 % — AB (ref 40.0–52.0)
Hemoglobin: 10.9 g/dL — ABNORMAL LOW (ref 13.0–18.0)
Lymphocytes Relative: 3 %
Lymphs Abs: 0.2 10*3/uL — ABNORMAL LOW (ref 1.0–3.6)
MCH: 35.7 pg — ABNORMAL HIGH (ref 26.0–34.0)
MCHC: 33.4 g/dL (ref 32.0–36.0)
MCV: 106.8 fL — ABNORMAL HIGH (ref 80.0–100.0)
MONO ABS: 0.8 10*3/uL (ref 0.2–1.0)
Monocytes Relative: 14 %
NEUTROS ABS: 4.8 10*3/uL (ref 1.4–6.5)
Neutrophils Relative %: 82 %
PLATELETS: 76 10*3/uL — AB (ref 150–440)
RBC: 3.06 MIL/uL — AB (ref 4.40–5.90)
RDW: 22.5 % — AB (ref 11.5–14.5)
WBC: 5.9 10*3/uL (ref 3.8–10.6)

## 2017-04-14 LAB — BRAIN NATRIURETIC PEPTIDE: B NATRIURETIC PEPTIDE 5: 959 pg/mL — AB (ref 0.0–100.0)

## 2017-04-14 LAB — AFP TUMOR MARKER: AFP, SERUM, TUMOR MARKER: 3.1 ng/mL (ref 0.0–8.3)

## 2017-04-14 MED ORDER — FUROSEMIDE 10 MG/ML IJ SOLN
10.0000 mg | Freq: Once | INTRAMUSCULAR | Status: AC
  Administered 2017-04-14: 10 mg via INTRAVENOUS
  Filled 2017-04-14: qty 4

## 2017-04-14 MED ORDER — POTASSIUM CHLORIDE CRYS ER 20 MEQ PO TBCR
40.0000 meq | EXTENDED_RELEASE_TABLET | Freq: Once | ORAL | Status: AC
  Administered 2017-04-14: 40 meq via ORAL
  Filled 2017-04-14: qty 2

## 2017-04-14 NOTE — Clinical Social Work Note (Signed)
Patient to be d/c'ed today to [facility] room 702.  Patient and family agreeable to plans will transport via ems RN to call report to 700 hall nurse [phone].  Windell MouldingEric Nikko Goldwire, MSW, Theresia MajorsLCSWA [phone]

## 2017-04-14 NOTE — Progress Notes (Signed)
Pt prepared for d/c to SNF. IV d/c'd. Skin intact except as charted in most recent assessments, foam dressing changed prior to transportation. Vitals are stable. Report called to receiving facility. Pt to be transported by ambulance service. Wife called and updated on pt going to [facility].   Starlett Pehrson Murphy OilWittenbrook

## 2017-04-14 NOTE — Discharge Summary (Signed)
[facility] - [place] at [facility]   PATIENT NAME: Kyle Rollins    MR#:  [id number]  DATE OF BIRTH:  1927/08/02  DATE OF ADMISSION:  04/10/2017   ADMITTING PHYSICIAN: Auburn BilberryShreyang Patel, MD  DATE OF DISCHARGE: 04/14/2017 PRIMARY CARE PHYSICIAN: Kandyce RudBabaoff, Marcus, MD   ADMISSION DIAGNOSIS:  Melena [K92.1] Anasarca [R60.1] Supratherapeutic INR [R79.1] DISCHARGE DIAGNOSIS:  Active Problems:   GI bleed  SECONDARY DIAGNOSIS:   Past Medical History:  Diagnosis Date  . A-fib (HCC)   . AAA (abdominal aortic aneurysm) (HCC)   . Bradycardia   . CAD (coronary artery disease)   . CHF (congestive heart failure) (HCC)   . DM (diabetes mellitus) (HCC)   . Hyperlipidemia   . Hypertension   . Pancreatitis    HOSPITAL COURSE:   Patient is a 83 year old white male with multiple medical problems presenting with generalized weakness and GI bleed  1.Upper GI bleed with melena Stopped Coumadin Discontinued Protonix IV and changed to po per Dr. Allegra LaiVanga. Also the ER physician has given patient vitamin K and s/p PRBC tranfusion. There is no indication for EGD at this time as pt is not actively bleeding per Dr. Allegra LaiVanga.  Anemia due to acute blood loss.  Status post PRBC transfusion.  Hemoglobin is 10, stable.  2.Acute on chronic systolic CHF He was on IV Lasix, changed to 20 mg po daily due to low BP. Follow-up echocardiogram: Systolic function was normal.   The estimated ejection fraction was in the range of 55% to 60%.  ARF.  improving. Follow-up BMP while on Lasix. Hyperkalemia. Improved. Hypokalemia, continue potassium, follow-up level with PCP. Hyponatremia.  improving.  3.Chronic atrial fibrillation Hold Coumadin, Continuation of metoprolol for heart rate control per Dr. Gwen PoundsKowalski.  4.Coronary artery disease.  Discontinue isosorbide mononitrate and continue metoprolol, hold if BP is low.  5.Hyperlipidemia unspecified continue  Mevacor  6.Miscellaneous SCDs for DVT prophylaxis  Liver cirrhosis with moderate ascites.  Paracentesis with 3 L fluid draw. Follow up test result with PCP.  Generalized weakness.  PT evaluation: SNF.  DISCHARGE CONDITIONS:  Stable, discharge to SNF toay. CONSULTS OBTAINED:  Treatment Team:  Toney ReilVanga, Rohini Reddy, MD Lamar BlinksKowalski, Bruce J, MD DRUG ALLERGIES:  No Known Allergies DISCHARGE MEDICATIONS:   Allergies as of 04/14/2017   No Known Allergies     Medication List    STOP taking these medications   isosorbide dinitrate 30 MG tablet Commonly known as:  ISORDIL   warfarin 2 MG tablet Commonly known as:  COUMADIN     TAKE these medications   bisacodyl 5 MG EC tablet Commonly known as:  DULCOLAX Take 1 tablet (5 mg total) by mouth daily as needed for moderate constipation.   furosemide 20 MG tablet Commonly known as:  LASIX Take 1 tablet (20 mg total) by mouth daily. What changed:    medication strength  how much to take   KLOR-CON M20 20 MEQ tablet Generic drug:  potassium chloride SA Take 1 tablet by mouth 2 (two) times daily.   lovastatin 40 MG tablet Commonly known as:  MEVACOR Take 2 tablets by mouth daily.   metoprolol tartrate 25 MG tablet Commonly known as:  LOPRESSOR Take 0.5 tablets (12.5 mg total) by mouth 2 (two) times daily. What changed:  how much to take   nitroGLYCERIN 0.4 MG SL tablet Commonly known as:  NITROSTAT Place 1 tablet under the tongue. Every 5 minutes as needed for chest pain. May take up to 3 doses.  pantoprazole 40 MG tablet Commonly known as:  PROTONIX Take 1 tablet (40 mg total) by mouth daily.        DISCHARGE INSTRUCTIONS:  See AVS.  If you experience worsening of your admission symptoms, develop shortness of breath, life threatening emergency, suicidal or homicidal thoughts you must seek medical attention immediately by calling 911 or calling your MD immediately  if symptoms less severe.  You Must read  complete instructions/literature along with all the possible adverse reactions/side effects for all the Medicines you take and that have been prescribed to you. Take any new Medicines after you have completely understood and accpet all the possible adverse reactions/side effects.   Please note  You were cared for by a hospitalist during your hospital stay. If you have any questions about your discharge medications or the care you received while you were in the hospital after you are discharged, you can call the unit and asked to speak with the hospitalist on call if the hospitalist that took care of you is not available. Once you are discharged, your primary care physician will handle any further medical issues. Please note that NO REFILLS for any discharge medications will be authorized once you are discharged, as it is imperative that you return to your primary care physician (or establish a relationship with a primary care physician if you do not have one) for your aftercare needs so that they can reassess your need for medications and monitor your lab values.    On the day of Discharge:  VITAL SIGNS:  Blood pressure (!) 111/54, pulse 67, temperature 98 F (36.7 C), temperature source Oral, resp. rate 16, height 5\' 5"  (1.651 m), weight 177 lb 9.6 oz (80.6 kg), SpO2 96 %. PHYSICAL EXAMINATION:  GENERAL:  83 y.o.-year-old patient lying in the bed with no acute distress.  EYES: Pupils equal, round, reactive to light and accommodation. No scleral icterus. Extraocular muscles intact.  HEENT: Head atraumatic, normocephalic. Oropharynx and nasopharynx clear.  NECK:  Supple, no jugular venous distention. No thyroid enlargement, no tenderness.  LUNGS: Normal breath sounds bilaterally, no wheezing, rales,rhonchi or crepitation. No use of accessory muscles of respiration.  CARDIOVASCULAR: S1, S2 normal. No murmurs, rubs, or gallops.  ABDOMEN: Soft, non-tender, non-distended. Bowel sounds present. No  organomegaly or mass.  EXTREMITIES: bilateral leg edema 1+, no cyanosis, or clubbing.  NEUROLOGIC: Cranial nerves II through XII are intact. Muscle strength 4/5 in all extremities. Sensation intact. Gait not checked.  PSYCHIATRIC: The patient is alert and oriented x 3.  SKIN: No obvious rash, lesion, or ulcer.  DATA REVIEW:   CBC Recent Labs  Lab 04/11/17 0050  04/12/17 0705  WBC 5.6  --   --   HGB 10.1*   < > 10.1*  HCT 29.7*  --   --   PLT 105*  --   --    < > = values in this interval not displayed.    Chemistries  Recent Labs  Lab 04/10/17 1223  04/12/17 0705  04/14/17 0422  NA 134*   < > 133*   < > 133*  K 5.4*   < > 3.4*   < > 3.4*  CL 100*   < > 101   < > 98*  CO2 24   < > 22   < > 25  GLUCOSE 122*   < > 102*   < > 130*  BUN 65*   < > 63*   < > 59*  CREATININE 1.89*   < >  1.80*   < > 1.49*  CALCIUM 8.3*   < > 7.9*   < > 7.6*  MG  --   --  1.9  --   --   AST 69*  --   --   --   --   ALT 25  --   --   --   --   ALKPHOS 51  --   --   --   --   BILITOT 0.9  --   --   --   --    < > = values in this interval not displayed.     Microbiology Results  Results for orders placed or performed during the hospital encounter of 04/10/17  Gram stain     Status: None   Collection Time: 04/13/17  3:40 PM  Result Value Ref Range Status   Specimen Description PERITONEAL  Final   Special Requests NONE  Final   Gram Stain   Final    CYTOSPIN SLIDE MODERATE WBC PRESENT,BOTH PMN AND MONONUCLEAR NO ORGANISMS SEEN    Report Status 04/13/2017 FINAL  Final    RADIOLOGY:  [place] Paracentesis  Result Date: 04/13/2017 CLINICAL DATA:  Cirrhosis, moderate ascites EXAM: ULTRASOUND GUIDED PARACENTESIS TECHNIQUE: The procedure, risks (including but not limited to bleeding, infection, organ damage ), benefits, and alternatives were explained to the patient. Questions regarding the procedure were encouraged and answered. The patient understands and consents to the procedure. Survey  ultrasound of the abdomen was performed and an appropriate skin entry site in the right upper abdomen was selected. Skin site was marked, prepped with chlorhexadine, draped in usual sterile fashion, and infiltrated locally with 1% lidocaine. A Safe-T-Centesis needle was advanced into the peritoneal space until fluid could be aspirated. The sheath was advanced and the needle removed. 3 L of clear yellow ascites were aspirated. Samples sent for the requested laboratory studies. The patient tolerated the procedure well. COMPLICATIONS: COMPLICATIONS none IMPRESSION: Technically successful ultrasound guided paracentesis, removing 3 L ascites. Electronically Signed   By: Corlis Leak  Hassell M.D.   On: 04/13/2017 16:10     Management plans discussed with the patient, family and they are in agreement.  CODE STATUS: Full Code   TOTAL TIME TAKING CARE OF THIS PATIENT: 33 minutes.    Shaune PollackQing Amare Kontos M.D on 04/14/2017 at 7:27 AM  Between 7am to 6pm - Pager - [phone]  After 6pm go to [email or web address] - Social research officer, [email or web address] [email or web address]  [facility] [place] [facility]  Office  [phone]  CC: Primary care physician; Kandyce RudBabaoff, Marcus, MD   Note: This dictation was prepared with Dragon dictation along with smaller phrase technology. Any transcriptional errors that result from this process are unintentional.

## 2017-04-14 NOTE — ED Provider Notes (Addendum)
[facility] Emergency Department Provider Note  ____________________________________________   I have reviewed the triage vital signs and the nursing notes.   HISTORY  Chief Complaint Chest Pain and Shortness of Breath    HPI Kyle Shorterrvin C Hafley Sr. is a 83 y.o. male with history of A. fib and AAA in the past bradycardia CAD CHF diabetes mellitus recent GI bleed pancreatitis, whose records I have reviewed presents today complaining of "nothing".  However, he was discharged this point from the hospital after a admission for GI bleed, and on the way to the nursing facility he states the cold air and bouncing around made his chest hurt.  When he got there he had no pain and they were going to put him there in the rehab facility however, they refused to take him given that he had a chest pain.  They put him back in the ambulance on the way back to cold air and bouncing around state he states made his chest pain come back.  Is a nonspecific discomfort which he cannot describe he states "it is because of the cold air and I was being bounced around".  He has no pain at this time or other symptoms he would like to leave the hospital if possible.  Somewhat limited history but no further information is given.   Location: Sternal Radiation: None Quality: It was uncomfortable Duration: See above Timing: See above Severity: Mild Associated sxs: None PriorTreatment : None   Past Medical History:  Diagnosis Date  . A-fib (HCC)   . AAA (abdominal aortic aneurysm) (HCC)   . Bradycardia   . CAD (coronary artery disease)   . CHF (congestive heart failure) (HCC)   . DM (diabetes mellitus) (HCC)   . Hyperlipidemia   . Hypertension   . Pancreatitis     Patient Active Problem List   Diagnosis Date Noted  . GI bleed 04/10/2017  . Acute on chronic systolic heart failure (HCC) 03/03/2017  . AKI (acute kidney injury) (HCC) 03/03/2017    Past Surgical History:  Procedure  Laterality Date  . CORONARY ARTERY BYPASS GRAFT     x2  . ENDOVASCULAR REPAIR/STENT GRAFT      Prior to Admission medications   Medication Sig Start Date End Date Taking? Authorizing Provider  bisacodyl (DULCOLAX) 5 MG EC tablet Take 1 tablet (5 mg total) by mouth daily as needed for moderate constipation. 04/13/17  Yes Shaune Pollackhen, Qing, MD  furosemide (LASIX) 20 MG tablet Take 1 tablet (20 mg total) by mouth daily. 04/13/17  Yes Shaune Pollackhen, Qing, MD  KLOR-CON M20 20 MEQ tablet Take 1 tablet by mouth 2 (two) times daily. 03/13/17  Yes [provider]  metoprolol tartrate (LOPRESSOR) 25 MG tablet Take 0.5 tablets (12.5 mg total) by mouth 2 (two) times daily. 04/13/17  Yes Shaune Pollackhen, Qing, MD  pantoprazole (PROTONIX) 40 MG tablet Take 1 tablet (40 mg total) by mouth daily. 04/13/17  Yes Shaune Pollackhen, Qing, MD  lovastatin (MEVACOR) 40 MG tablet Take 2 tablets by mouth daily.  02/07/17   [provider]  nitroGLYCERIN (NITROSTAT) 0.4 MG SL tablet Place 1 tablet under the tongue. Every 5 minutes as needed for chest pain. May take up to 3 doses. 09/30/16 09/30/17  [provider]  warfarin (COUMADIN) 2 MG tablet Take 2 mg by mouth daily. 04/05/17   [provider]    Allergies Patient has no known allergies.  Family History  Problem Relation Age of Onset  . CAD Father  Social History Social History   Tobacco Use  . Smoking status: Former Smoker    Types: Cigarettes  . Smokeless tobacco: Former Neurosurgeon    Types: Snuff  Substance Use Topics  . Alcohol use: No  . Drug use: No    Review of Systems Constitutional: No fever/chills Eyes: No visual changes. ENT: No sore throat. No stiff neck no neck pain Cardiovascular: Denies chest pain. Respiratory: Denies shortness of breath. Gastrointestinal:   no vomiting.  No diarrhea.  No constipation. Genitourinary: Negative for dysuria. Musculoskeletal: Negative lower extremity swelling Skin: Negative for rash. Neurological: Negative  for severe headaches, focal weakness or numbness.   ____________________________________________   PHYSICAL EXAM:  VITAL SIGNS: ED Triage Vitals  Enc Vitals Group     BP 04/14/17 1139 133/63     Pulse Rate 04/14/17 1139 61     Resp 04/14/17 1139 (!) 22     Temp 04/14/17 1139 98.5 F (36.9 C)     Temp Source 04/14/17 1139 Oral     SpO2 04/14/17 1139 93 %     Weight 04/14/17 1140 177 lb (80.3 kg)     Height 04/14/17 1140 5\' 5"  (1.651 m)     Head Circumference --      [facility] Flow --      Pain Score --      Pain Loc --      Pain Edu? --      Excl. in GC? --     Constitutional: Alert and oriented. Well appearing and in no acute distress. Eyes: Conjunctivae are normal Head: Atraumatic HEENT: No congestion/rhinnorhea. Mucous membranes are moist.  Oropharynx non-erythematous Neck:   Nontender with no meningismus, no masses, no stridor Cardiovascular: Normal rate, regular rhythm. Grossly normal heart sounds.  Good peripheral circulation. Respiratory: Normal respiratory effort.  No retractions. Lungs CTAB. Abdominal: Soft and nontender. No distention. No guarding no rebound Back:  There is no focal tenderness or step off.  there is no midline tenderness there are no lesions noted. there is no CVA tenderness Musculoskeletal: No lower extremity tenderness, no upper extremity tenderness. No joint effusions, no DVT signs strong distal pulses positive chronic appearing bilateral symmetric edema Neurologic:  Normal speech and language. No gross focal neurologic deficits are appreciated.  Skin: Changes, chronic diffusely noted Psychiatric: Mood and affect are normal. Speech and behavior are normal.  ____________________________________________   LABS (all labs ordered are listed, but only abnormal results are displayed)  Labs Reviewed  BASIC METABOLIC PANEL - Abnormal; Notable for the following components:      Result Value   Sodium 134 (*)    Chloride 100 (*)    Glucose, Bld 123 (*)     BUN 57 (*)    Creatinine, Ser 1.46 (*)    Calcium 7.9 (*)    GFR calc non Af Amer 41 (*)    GFR calc Af Amer 48 (*)    All other components within normal limits  CBC WITH DIFFERENTIAL/PLATELET - Abnormal; Notable for the following components:   RBC 3.06 (*)    Hemoglobin 10.9 (*)    HCT 32.7 (*)    MCV 106.8 (*)    MCH 35.7 (*)    RDW 22.5 (*)    Platelets 76 (*)    Lymphs Abs 0.2 (*)    All other components within normal limits  PROTIME-INR - Abnormal; Notable for the following components:   Prothrombin Time 16.6 (*)    All other components within normal  limits  TROPONIN I - Abnormal; Notable for the following components:   Troponin I 0.04 (*)    All other components within normal limits  CBC WITH DIFFERENTIAL/PLATELET  BRAIN NATRIURETIC PEPTIDE    Pertinent labs  results that were available during my care of the patient were reviewed by me and considered in my medical decision making (see chart for details). ____________________________________________  EKG  I personally interpreted any EKGs ordered by me or triage V paced rhythm rate 72 ____________________________________________  RADIOLOGY  Pertinent labs & imaging results that were available during my care of the patient were reviewed by me and considered in my medical decision making (see chart for details). If possible, patient and/or family made aware of any abnormal findings.  Dg Chest 2 View  Result Date: 04/14/2017 CLINICAL DATA:  Short of breath for 1 month. CHF and history of pacemaker. EXAM: CHEST  2 VIEW COMPARISON:  04/10/2017 FINDINGS: Lateral view degraded by patient arm position. Midline trachea. Cardiomegaly accentuated by AP portable technique. Trace bilateral pleural effusions are similar. Pulmonary interstitial prominence is slightly progressive, especially on the right. No well-defined lobar consolidation. Left base scarring or atelectasis. Low lung volumes. Single lead pacer. Median sternotomy  for CABG. Aortic stent graft. Cholecystectomy. IMPRESSION: Cardiomegaly with progressive mild to moderate congestive heart failure. Concurrent small bilateral pleural effusions. Electronically Signed   By: Jeronimo GreavesKyle  Talbot M.D.   On: 04/14/2017 12:05   [place] Paracentesis  Result Date: 04/13/2017 CLINICAL DATA:  Cirrhosis, moderate ascites EXAM: ULTRASOUND GUIDED PARACENTESIS TECHNIQUE: The procedure, risks (including but not limited to bleeding, infection, organ damage ), benefits, and alternatives were explained to the patient. Questions regarding the procedure were encouraged and answered. The patient understands and consents to the procedure. Survey ultrasound of the abdomen was performed and an appropriate skin entry site in the right upper abdomen was selected. Skin site was marked, prepped with chlorhexadine, draped in usual sterile fashion, and infiltrated locally with 1% lidocaine. A Safe-T-Centesis needle was advanced into the peritoneal space until fluid could be aspirated. The sheath was advanced and the needle removed. 3 L of clear yellow ascites were aspirated. Samples sent for the requested laboratory studies. The patient tolerated the procedure well. COMPLICATIONS: COMPLICATIONS none IMPRESSION: Technically successful ultrasound guided paracentesis, removing 3 L ascites. Electronically Signed   By: Corlis Leak  Hassell M.D.   On: 04/13/2017 16:10   ____________________________________________    PROCEDURES  Procedure(s) performed: None  Procedures  Critical Care performed: None  ____________________________________________   INITIAL IMPRESSION / ASSESSMENT AND PLAN / ED COURSE  Pertinent labs & imaging results that were available during my care of the patient were reviewed by me and considered in my medical decision making (see chart for details).  She is here with some very atypical chest pain which he states happened while he was in the ambulance on the way to the skilled nursing facility  after leaving this hospital.  EKG is paced which makes it difficult to interpret the rhythm.  It is his feeling that being bounced around in the ambulance and breathing cold air caused this discomfort.  We will send serial cardiac enzymes and reassess.  He is pain-free here thus far.  Blood work is reassuring thus far vital signs are reassuring thus far  ----------------------------------------- 3:19 PM on 04/14/2017 -----------------------------------------  No acute distress awaiting second troponin I discussed with his cardiologist, Dr. Cassie FreerParachos.  Who knows the patient personally, states that this patient does have chronic pain and does not  wish any intervention in any event, patient himself would prefer to go home.  Cardiology would feel very comfortable with the patient being discharged and they will see him in the office very shortly as long as a second troponin is not trending significantly.  This does not seem to be in reasonable and we will follow this plan  ----------------------------------------- 5:53 PM on 04/14/2017 -----------------------------------------  [name] initial blood draw was delayed because he was a difficult IV stick and the IV team had to come acquire blood that took several hours apparently, at this time, second troponin is reported at 0.04 according to cardiologist, this is on remarkable for this patient in this is essentially normal for him looking back through time.  We will discharge him he has no ongoing chest pain patient does take nitroglycerin on a regular basis he states several times a week and he has refused intervention according to cardiology there is no further indication for admission or workup here.  He does have some evidence of mild heart failure at baseline, he is not symptomatic with this.  I will give him a touch of Lasix prior to discharge and repeat cardiac enzymes have been reassuring.   ____________________________________________   FINAL  CLINICAL IMPRESSION(S) / ED DIAGNOSES  Final diagnoses:  None      This chart was dictated using voice recognition software.  Despite best efforts to proofread,  errors can occur which can change meaning.      Jeanmarie Plant, MD 04/14/17 1610    Jeanmarie Plant, MD 04/14/17 1520    Jeanmarie Plant, MD 04/14/17 [phone]

## 2017-04-14 NOTE — ED Notes (Signed)
Pt reports that he feels "achey" in general at this time, but denies any actual pain.  Pt also denying chest pain at this time.

## 2017-04-14 NOTE — ED Notes (Signed)
Date and time results received: 04/14/17 1339   Test: Trop Critical Value: 0.04  Name of Provider Notified: Dr. Alphonzo LemmingsMcshane  Orders Received? Or Actions Taken?: Critcal Results Acknowledged

## 2017-04-14 NOTE — Progress Notes (Signed)
LCSW medical social worker contacted ED SW to advise me this patient is from [facility] and was just here. We are to contact [facility] if patient to d/c back.  Henery Betzold LCSW

## 2017-04-14 NOTE — ED Notes (Signed)
Lab called to inform that they did not receive the 2nd Troponin that was collected at 1526; re-drew the 2nd Troponin and sent to the lab.

## 2017-04-14 NOTE — ED Notes (Addendum)
Patient discharge and follow up information reviewed with patient by ED nursing staff and patient given the opportunity to ask questions pertaining to ED visit and discharge plan of care. Patient advised that should symptoms not continue to improve, resolve entirely, or should new symptoms develop then a follow up visit with their PCP or a return visit to the ED may be warranted. Patient verbalized consent and understanding of discharge plan of care including potential need for further evaluation. Patient being discharged in stable condition per attending ED physician on duty.   Report called to [place] at [facility]

## 2017-04-14 NOTE — Care Management (Signed)
Patient to discharge to [facility] today.  CSW facilitating.  Kyle Rollins with [facility] notified of discharge disposition.

## 2017-04-14 NOTE — Clinical Social Work Placement (Signed)
   CLINICAL SOCIAL WORK PLACEMENT  NOTE  Date:  04/14/2017  Patient Details  Name: Debbe Balesrvin C Selke Sr. MRN: [id number] Date of Birth: 1927-05-22  Clinical Social Work is seeking post-discharge placement for this patient at the Skilled  Nursing Facility level of care (*CSW will initial, date and re-position this form in  chart as items are completed):  Yes   Patient/family provided with [facility] Clinical Social Work Department's list of facilities offering this level of care within the geographic area requested by the patient (or if unable, by the patient's family).  Yes   Patient/family informed of their freedom to choose among providers that offer the needed level of care, that participate in Medicare, Medicaid or managed care program needed by the patient, have an available bed and are willing to accept the patient.  Yes   Patient/family informed of [facility]'s ownership interest in [facility] and [facility], as well as of the fact that they are under no obligation to receive care at these facilities.  PASRR submitted to EDS on 04/12/17     PASRR number received on 04/12/17     Existing PASRR number confirmed on       FL2 transmitted to all facilities in geographic area requested by pt/family on 04/12/17     FL2 transmitted to all facilities within larger geographic area on       Patient informed that his/her managed care company has contracts with or will negotiate with certain facilities, including the following:        Yes   Patient/family informed of bed offers received.  Patient chooses bed at [facility]     Physician recommends and patient chooses bed at      Patient to be transferred to [facility] on 04/14/17.  Patient to be [facility] to facility [facility]     Patient family notified on 04/14/17 of transfer.  Name of family member notified:  Vanita PandaMarie Halfmann patient's wife     PHYSICIAN Please sign FL2      Additional Comment:    _______________________________________________ Darleene CleaverAnterhaus, Mairen Wallenstein R, LCSWA 04/14/2017, 8:18 AM

## 2017-04-14 NOTE — ED Triage Notes (Signed)
Pt brought in by [facility] from [facility].  Pt d/c from floor today for recent GI bleed.  Pt was new admit to [facility] and pt started to have "chest pain so bad that I can't breathe" during ride to [facility].  Upon arriving at [facility], ALS truck was called to assess patient.  Pt states that his chest pain had subsided and did not want to go back to hospital.  [facility] refusing admit because of this event and pt brought to ER for eval.  Pt started to have CP again in route, possibly due to movement in back of truck.  Per EMS, pt did have one episode of coughing up something dark, unsure if it was blood or not.  Per EMS, pt's vital on truck: HR: 94 paced; 96% on RA; 134/78; RR 20. Pt is A&O and in NAD.

## 2017-04-15 NOTE — ED Notes (Signed)
Pt had bowel loose movement; pt was cleaned, briefs changed, complete linen changed.

## 2017-04-16 LAB — PH, BODY FLUID: PH, BODY FLUID: 7.8

## 2017-04-18 LAB — TOTAL BILIRUBIN, BODY FLUID

## 2017-04-19 ENCOUNTER — Other Ambulatory Visit
Admission: RE | Admit: 2017-04-19 | Discharge: 2017-04-19 | Disposition: A | Discharge status: Home / Self Care | Payer: Medicare Other | Source: Ambulatory Visit | Attending: Family Medicine | Admitting: Family Medicine

## 2017-04-19 DIAGNOSIS — I5022 Chronic systolic (congestive) heart failure: Secondary | ICD-10-CM | POA: Diagnosis present

## 2017-04-19 LAB — BRAIN NATRIURETIC PEPTIDE: B Natriuretic Peptide: 908 pg/mL — ABNORMAL HIGH (ref 0.0–100.0)

## 2017-04-21 ENCOUNTER — Ambulatory Visit: Payer: Medicare Other | Admitting: Family

## 2017-04-21 ENCOUNTER — Telehealth: Payer: Self-pay | Admitting: Family

## 2017-04-21 NOTE — Telephone Encounter (Signed)
Patient did not show for his [facility] Failure [facility] appointment on 04/21/17. Will attempt to reschedule.

## 2017-04-21 NOTE — Progress Notes (Deleted)
   Patient ID: Kyle Balesrvin C Scharnhorst Sr., male    DOB: [birth year], 83 y.o.   MRN: [id number]  HPI  [name] is a 83 y/o male with a history of  Echo report from 04/13/17 reviewed and showed an EF of 55-60% along with moderate MR/PR and mod-severe TR.   Was in the ED 04/14/17 due to chest pain where he was treated and released. Admitted 04/10/17 due to UGI bleed with melena along with acute HF. Warfarin was stopped and he initially needed IV protonix and IV furosemide and then transitioned to oral medications. Given blood transfusion. Cardiology and GI consults were obtained. Discharged after 4 days. Was in the ED 03/28/17 due to peripheral edema where he was treated and released. Was in the ED 03/09/17 due to peripheral edema. Treated and released. Admitted 03/03/17 due to acute kidney injury and HF exacerbation. Initially needed IV lasix and then transitioned to oral lasix. Cardiology consult was obtained. Discharged after 3 days.   He presents today for his initial visit with a chief complaint of   Review of Systems    Physical Exam    Assessment & Plan:  1: Chronic heart failure with preserved ejection fraction- - NYHA class  2: HTN- - last saw PCP Kyle Rollins(Babaoff) 04/04/17 - BMP from 04/14/17 reviewed and showed sodium 134, potassium 3.9 and GFR 41  3: Atrial fibrillation- - saw cardiologist (Kyle Rollins) 03/14/17

## 2017-04-26 ENCOUNTER — Other Ambulatory Visit
Admission: RE | Admit: 2017-04-26 | Discharge: 2017-04-26 | Disposition: A | Discharge status: Home / Self Care | Payer: Medicare Other | Source: Ambulatory Visit | Attending: Family Medicine | Admitting: Family Medicine

## 2017-04-26 DIAGNOSIS — I5022 Chronic systolic (congestive) heart failure: Secondary | ICD-10-CM | POA: Insufficient documentation

## 2017-04-26 LAB — BRAIN NATRIURETIC PEPTIDE: B Natriuretic Peptide: 1254 pg/mL — ABNORMAL HIGH (ref 0.0–100.0)

## 2017-04-27 ENCOUNTER — Other Ambulatory Visit: Payer: Self-pay

## 2017-04-27 ENCOUNTER — Ambulatory Visit (INDEPENDENT_AMBULATORY_CARE_PROVIDER_SITE_OTHER): Payer: Medicare Other | Admitting: Gastroenterology

## 2017-04-27 ENCOUNTER — Encounter (INDEPENDENT_AMBULATORY_CARE_PROVIDER_SITE_OTHER): Payer: Self-pay

## 2017-04-27 ENCOUNTER — Encounter: Payer: Self-pay | Admitting: Gastroenterology

## 2017-04-27 ENCOUNTER — Ambulatory Visit: Payer: Medicare Other | Admitting: Gastroenterology

## 2017-04-27 VITALS — BP 132/72 | HR 69 | Ht 65.0 in | Wt 163.0 lb

## 2017-04-27 DIAGNOSIS — K7469 Other cirrhosis of liver: Secondary | ICD-10-CM | POA: Diagnosis not present

## 2017-04-27 DIAGNOSIS — K761 Chronic passive congestion of liver: Secondary | ICD-10-CM | POA: Diagnosis not present

## 2017-04-27 NOTE — Progress Notes (Signed)
Melodie BouillonVarnita Lemya Greenwell, MD [address]  [address]  [place], [place] [address]  Main: [phone]  Fax: [phone]   Primary Care Physician: Kandyce RudBabaoff, Marcus, MD  Primary Gastroenterologist:  Dr. Melodie BouillonVarnita Semone Orlov  Chief Complaint  Patient presents with  . Establish Care    GI bleed (pt from [facility])     HPI: Debbe Balesrvin C Balsam Sr. is a 83 y.o. male here for follow up. He was seen by Dr. Allegra LaiVanga as inpatient consult for anemia and dark stools. During her consultation he was found to have dark green stools and Hgb was thought to be at baseline and Endoscopy was considered to be higher risk than benefit in the setting of no acute GI bleeding. Hgb is around 10 at baseline, and he presented at a Hgb of 9.9 on admission on 04/10/17 and was given 1 unit PRBC which did not lead to any improvement in his shortness of breath. Hgb was subsequently above 10 since then with no active GI bleeding throughout the admission. Ferritin was 200, Iron 29, TIBC 261 and per Dr. Verdis PrimeVanga's note this was thought to be due to anemia of chronic disease. Colonoscopy was year ago (no report available), no previous EGDs.   He presented to the hospital on 04/10/17 with shortness of breath and LE swelling. He has history of CABG in 1982, redo in 2000, CHF, EF50%, severe TR, moderate MR, PAF, dual chamber packemaker for sick syndrome, AAA endovascular repair 2004, was on anticoagulation with coumadin when he presented to the hospital. He was given Lasix during his admission and discharged on a dose of 20mg  daily. He was seen by his outpatient cardiologist on Nov 6th with plan to RTC in 3 weeks which has not happened yet. The cardiology report at the time noted that pt is to continue lasix 60mg  daily. It also noted that pt was admitted on 03/03/17 with acute on chronic diastolic CHF and Afib and underwent diuresis with IV lasix. [facility] ED on 03/09/17 for more IV diuresis and then PCP increased lasix from 40 to 60mg  after  that. His BNP has been elevated since October and it was recently 908, 8 days ago.   He reports shortness of breath at baseline has not changed. He denies any melena at home. Is living at a facility since discharge and is not able to move or clean himself. Wears a diaper, the staff at the facility clean him so him or family are not able to reliably tell me what his stool looks like.   Dr. Allegra LaiVanga ordered an Abdominal Ultrasound to evaluate for cirrhosis. He was found to have liver cirrhosis on abdominal ultrasound in addition to ascites. Portal vein patent. He had 3 L paracentesis during the admission. Thrombocytopenia with platelets 76 on discharge. INR was supratherapeutic at 3.5 on presentation and 1.35 on discharge after holding coumadin. Fluid studies did not show SBP. SAAG was >1.1 consistent with cirrhosis or cardiac ascites. Fluid protein is noted to be <3. Typically fluid protein >2.5 is more consistent with cardiac ascites, but the <3 value on the report does not allow for proper differntiation between cardiac ascites or cirrhotic ascites.   He continues to have LE swelling and also has abdominal distension. He does not recall if he felt better after his last paracentesis.    Current Outpatient Medications  Medication Sig Dispense Refill  . Amino Acids-Protein Hydrolys (FEEDING SUPPLEMENT, PRO-STAT SUGAR FREE 64,) LIQD Take 30 mLs by mouth 3 (three) times daily  with meals.    . bisacodyl (DULCOLAX) 5 MG EC tablet Take 1 tablet (5 mg total) by mouth daily as needed for moderate constipation. 30 tablet 0  . furosemide (LASIX) 20 MG tablet Take 1 tablet (20 mg total) by mouth daily. 30 tablet   . gentamicin (GARAMYCIN) 0.3 % ophthalmic solution Place 2 drops into both eyes every 4 (four) hours.    Marland Kitchen. KLOR-CON M20 20 MEQ tablet Take 1 tablet by mouth 2 (two) times daily.    Marland Kitchen. lovastatin (MEVACOR) 40 MG tablet Take 2 tablets by mouth daily.     . metoprolol tartrate (LOPRESSOR) 25 MG tablet Take  0.5 tablets (12.5 mg total) by mouth 2 (two) times daily.    . Multiple Vitamin (MULTIVITAMIN) tablet Take 1 tablet by mouth daily.    . nitroGLYCERIN (NITROSTAT) 0.4 MG SL tablet Place 1 tablet under the tongue. Every 5 minutes as needed for chest pain. May take up to 3 doses.    . pantoprazole (PROTONIX) 40 MG tablet Take 1 tablet (40 mg total) by mouth daily.    . silver sulfADIAZINE (SILVADENE) 1 % cream Apply 1 application topically daily.    Marland Kitchen. warfarin (COUMADIN) 2 MG tablet Take 2 mg by mouth daily.     No current facility-administered medications for this visit.     Allergies as of 04/27/2017  . (No Known Allergies)    ROS:  General: Negative for anorexia, weight loss, fever, chills, fatigue, weakness. ENT: Negative for hoarseness, difficulty swallowing , nasal congestion. CV: Negative for chest pain, angina, palpitations, dyspnea on exertion, peripheral edema.  Respiratory: Negative for dyspnea at rest, dyspnea on exertion, cough, sputum, wheezing.  GI: See history of present illness. GU:  Negative for dysuria, hematuria, urinary incontinence, urinary frequency, nocturnal urination.  Endo: Negative for unusual weight change.    Physical Examination:   BP 132/72   Pulse 69   Ht 5\' 5"  (1.651 m)   Wt 163 lb (73.9 kg)   BMI 27.12 kg/m   General: Well-nourished, well-developed in no acute distress.  Eyes: No icterus. Conjunctivae pink. Mouth: Oropharyngeal mucosa moist and pink , no lesions erythema or exudate. Lungs: Clear to auscultation bilaterally. Non-labored. Heart: Regular rate and rhythm, no murmurs rubs or gallops.  Abdomen: Bowel sounds are normal, nontender, distended abdomen, +fluid wave, no hepatosplenomegaly or masses, no abdominal bruits or hernia , no rebound or guarding.   Rectal: Yellow stool in diaper. DRE showed yellow stool with no blood or melena Extremities: 1+ lower extremity edema. No clubbing or deformities. Neuro: Alert and oriented x 3.   Grossly intact. Skin: Warm and dry, no jaundice.   Psych: Alert and cooperative, normal mood and affect.   Labs: CMP     Component Value Date/Time   NA 134 (L) 04/14/2017 1306   NA 139 08/06/2012 1154   K 3.9 04/14/2017 1306   K 4.6 08/06/2012 1154   CL 100 (L) 04/14/2017 1306   CL 106 08/06/2012 1154   CO2 24 04/14/2017 1306   CO2 29 08/06/2012 1154   GLUCOSE 123 (H) 04/14/2017 1306   GLUCOSE 106 (H) 08/06/2012 1154   BUN 57 (H) 04/14/2017 1306   BUN 18 08/06/2012 1154   CREATININE 1.46 (H) 04/14/2017 1306   CREATININE 1.13 08/06/2012 1154   CALCIUM 7.9 (L) 04/14/2017 1306   CALCIUM 8.2 (L) 08/06/2012 1154   PROT 6.8 04/10/2017 1223   ALBUMIN 2.2 (L) 04/10/2017 1223   AST 69 (H) 04/10/2017 1223  ALT 25 04/10/2017 1223   ALKPHOS 51 04/10/2017 1223   BILITOT 0.9 04/10/2017 1223   GFRNONAA 41 (L) 04/14/2017 1306   GFRNONAA >60 08/06/2012 1154   GFRAA 48 (L) 04/14/2017 1306   GFRAA >60 08/06/2012 1154   Lab Results  Component Value Date   WBC 5.9 04/14/2017   HGB 10.9 (L) 04/14/2017   HCT 32.7 (L) 04/14/2017   MCV 106.8 (H) 04/14/2017   PLT 76 (L) 04/14/2017    Imaging Studies: Dg Chest 2 View  Result Date: 04/14/2017 CLINICAL DATA:  Short of breath for 1 month. CHF and history of pacemaker. EXAM: CHEST  2 VIEW COMPARISON:  04/10/2017 FINDINGS: Lateral view degraded by patient arm position. Midline trachea. Cardiomegaly accentuated by AP portable technique. Trace bilateral pleural effusions are similar. Pulmonary interstitial prominence is slightly progressive, especially on the right. No well-defined lobar consolidation. Left base scarring or atelectasis. Low lung volumes. Single lead pacer. Median sternotomy for CABG. Aortic stent graft. Cholecystectomy. IMPRESSION: Cardiomegaly with progressive mild to moderate congestive heart failure. Concurrent small bilateral pleural effusions. Electronically Signed   By: Jeronimo Greaves M.D.   On: 04/14/2017 12:05   Dg Chest 2  View  Result Date: 04/10/2017 CLINICAL DATA:  Weakness, fatigue, blood in stool BILATERAL leg edema and pain, on Coumadin, slipped out of chair yesterday, history CHF, CABG EXAM: CHEST  2 VIEW COMPARISON:  03/09/2017 FINDINGS: LEFT subclavian pacemaker lead projects at RIGHT ventricle unchanged. Upper normal size of cardiac silhouette post CABG. Atherosclerotic calcification aorta. Decreased lung volumes with scattered atelectasis LEFT base and RIGHT mid lung. No definite infiltrate, pleural effusion or pneumothorax. Mild central peribronchial thickening. Bones demineralized. IMPRESSION: Post CABG and pacemaker. Bronchitic changes with scattered atelectasis in both lungs. Electronically Signed   By: Ulyses Southward M.D.   On: 04/10/2017 13:03   Dg Elbow Complete Left  Result Date: 04/10/2017 CLINICAL DATA:  Patient slipped out of a chair yesterday and has bruising and skin tear is over the left elbow. EXAM: LEFT ELBOW - COMPLETE 3+ VIEW COMPARISON:  None in PACs FINDINGS: The bones are subjectively adequately mineralized. The radial head is intact. The olecranon and distal humerus are intact. There is no joint effusion. Mild soft tissue swelling posteriorly may reflect olecranon bursitis. IMPRESSION: There is no acute fracture nor dislocation of the left elbow. Electronically Signed   By: David  [place] M.D.   On: 04/10/2017 14:28   Ct Head Wo Contrast  Result Date: 04/10/2017 CLINICAL DATA:  Progressive weakness and fatigue over the past few weeks. EXAM: CT HEAD WITHOUT CONTRAST TECHNIQUE: Contiguous axial images were obtained from the base of the skull through the vertex without intravenous contrast. COMPARISON:  None. FINDINGS: Brain: There is some cortical atrophy. No evidence of acute abnormality including hemorrhage, infarct, mass lesion, mass effect, midline shift or abnormal extra-axial fluid collection. No hydrocephalus or pneumocephalus. Vascular: Atherosclerosis noted. Skull: Intact. Sinuses/Orbits:  Negative. Other: None. IMPRESSION: No acute abnormality. Mild atrophy. Atherosclerosis. Electronically Signed   By: Drusilla Kanner M.D.   On: 04/10/2017 13:07   US Abdomen Complete  Result Date: 04/11/2017 CLINICAL DATA:  Thrombocytopenia. EXAM: ABDOMEN ULTRASOUND COMPLETE COMPARISON:  CT abdomen and pelvis 10/01/2009 and abdominal ultrasound 09/07/2009 FINDINGS: Examination was technically limited by a abdominal edema and ascites. Gallbladder: No gallstones or wall thickening visualized. No sonographic Murphy sign noted by sonographer. Common bile duct: Diameter: 5 mm Liver: Nodular contour with heterogeneous, mildly echogenic echotexture. No focal lesion identified. Portal vein is patent on  color Doppler imaging with normal direction of blood flow towards the liver. IVC: No abnormality visualized. Pancreas: Not well visualized. Spleen: Size and appearance within normal limits. Right Kidney: Length: 10.0 cm. Increased echogenicity. No mass or hydronephrosis visualized. Left Kidney: Length: 6.9 cm. Increased echogenicity. Lower pole scarring on prior CT is partially obscured by artifact from bowel gas. No mass or hydronephrosis visualized. Abdominal aorta: No evidence of proximal abdominal aortic aneurysm. Mid and distal abdominal aorta were obscured. Other findings: Moderate volume ascites. IMPRESSION: 1. Cirrhosis with moderate volume ascites. 2. Echogenic kidneys compatible with medical renal disease. Asymmetric left renal atrophy/scarring. No hydronephrosis. 3. Normal appearance of the spleen. Electronically Signed   By: Sebastian Ache M.D.   On: 04/11/2017 16:08   US Paracentesis  Result Date: 04/13/2017 CLINICAL DATA:  Cirrhosis, moderate ascites EXAM: ULTRASOUND GUIDED PARACENTESIS TECHNIQUE: The procedure, risks (including but not limited to bleeding, infection, organ damage ), benefits, and alternatives were explained to the patient. Questions regarding the procedure were encouraged and answered.  The patient understands and consents to the procedure. Survey ultrasound of the abdomen was performed and an appropriate skin entry site in the right upper abdomen was selected. Skin site was marked, prepped with chlorhexadine, draped in usual sterile fashion, and infiltrated locally with 1% lidocaine. A Safe-T-Centesis needle was advanced into the peritoneal space until fluid could be aspirated. The sheath was advanced and the needle removed. 3 L of clear yellow ascites were aspirated. Samples sent for the requested laboratory studies. The patient tolerated the procedure well. COMPLICATIONS: COMPLICATIONS none IMPRESSION: Technically successful ultrasound guided paracentesis, removing 3 L ascites. Electronically Signed   By: Corlis Leak M.D.   On: 04/13/2017 16:10    Assessment and Plan:   SAMNANG SHUGARS Sr. is a 83 y.o. y/o male with significant cardiac history with congestive heart failure, chronic macrocytic anemia here for hospital follow up  Anemia -Review of hospital notes and records do not reveal any evidence of GI bleeding during or right before the presentation -He has been started on once a day protonix which is appropriate, will continue this for now -Anemia is likely from chronic disease given that is macrocytic and iron workup do not reveal iron deficiency -Pt. Reports being seen by Dr. Mechele Collin before. There is an encounter in 2006 for endoscopy and again in 2011 for possibly for an office visit but we are unable to find the note.  -Given his recent acute on chronic CHF and no signs of acute GI bleeding, risks of endoscopy would outweigh benefits at this time  Congestive Heart Failure -This is the likely source of his recent LE swelling and shortness of breath as evidenced by his elevated BNP -He needs close follow up with his cardiologist has not seen them since hospital discharge -His Lasix likely needs to be titrated and I have encouraged them to keep their appointment with heart  failure clinic and sent instructions to his facility to make an appointment with patient's cardiologist in 1-2 weeks -[name] timeline of resuming Coumadin would need to be determined by Cardiology. There has been no evidence of active GI bleeding recently. If Coumadin is resumed INR should be maintained at a therapeutic level and Hgb monitored closely when Coumadin is first started.   Cirrhosis -Pt. And family deny any previous alcohol use -This is likely related to his heart disease -His CT Abdomen in 2011 does not report any Cirrhosis -Due to his abdominal distension we will schedule for paracentesis with  fluid studies. Instructions for administering albumin if >5L removed also placed with the orders. Bloodwork ordered to be done the same day as paracentesis -No previous hx of variceal bleeding -No evidence of GI bleeding currently or recently -Further workup of cirrhosis can be done after patient clinically improves from a CHF standpoint. EGD for variceal screening will have higher risks than benefits at this time given recent acute CHF.  -No signs of encephalopathy at this time  Renal Insufficiency -Cardiology team recommended renal consult inpatient but I do not see a nephrology consult note -Will refer to nephrology at this time  Will follow closely. Follow up in 3-4 weeks  Dr Melodie Bouillon

## 2017-04-28 ENCOUNTER — Telehealth: Payer: Self-pay

## 2017-04-28 ENCOUNTER — Other Ambulatory Visit: Payer: Self-pay

## 2017-04-28 DIAGNOSIS — D638 Anemia in other chronic diseases classified elsewhere: Secondary | ICD-10-CM

## 2017-04-28 DIAGNOSIS — K7469 Other cirrhosis of liver: Secondary | ICD-10-CM

## 2017-04-28 NOTE — Telephone Encounter (Signed)
LMTCO. His father is to have another referral to nephrology for renal insufficiency.

## 2017-05-01 NOTE — Discharge Instructions (Signed)
Paracentesis, Care After °Refer to this sheet in the next few weeks. These instructions provide you with information about caring for yourself after your procedure. Your health care provider may also give you more specific instructions. Your treatment has been planned according to current medical practices, but problems sometimes occur. Call your health care provider if you have any problems or questions after your procedure. °What can I expect after the procedure? °After your procedure, it is common to have a small amount of clear fluid coming from the puncture site. °Follow these instructions at home: °· Return to your normal activities as told by your health care provider. Ask your health care provider what activities are safe for you. °· Take over-the-counter and prescription medicines only as told by your health care provider. °· Do not take baths, swim, or use a hot tub until your health care provider approves. °· Follow instructions from your health care provider about: °? How to take care of your puncture site. °? When and how you should change your bandage (dressing). °? When you should remove your dressing. °· Check your puncture area every day signs of infection. Watch for: °? Redness, swelling, or pain. °? Fluid, blood, or pus. °· Keep all follow-up visits as told by your health care provider. This is important. °Contact a health care provider if: °· You have redness, swelling, or pain at your puncture site. °· You start to have more clear fluid coming from your puncture site. °· You have blood or pus coming from your puncture site. °· You have chills. °· You have a fever. °Get help right away if: °· You develop chest pain or shortness of breath. °· You develop increasing pain, discomfort, or swelling in your abdomen. °· You feel dizzy or light-headed or you pass out. °This information is not intended to replace advice given to you by your health care provider. Make sure you discuss any questions you  have with your health care provider. °Document Released: 09/09/2014 Document Revised: 10/01/2015 Document Reviewed: 07/08/2014 °Elsevier Interactive Patient Education © 2018 [employer] ° °

## 2017-05-04 ENCOUNTER — Telehealth: Payer: Self-pay

## 2017-05-04 ENCOUNTER — Ambulatory Visit
Admission: RE | Admit: 2017-05-04 | Discharge: 2017-05-04 | Disposition: A | Discharge status: Home / Self Care | Payer: Medicare Other | Source: Ambulatory Visit | Attending: Gastroenterology | Admitting: Gastroenterology

## 2017-05-04 DIAGNOSIS — K7469 Other cirrhosis of liver: Secondary | ICD-10-CM

## 2017-05-04 LAB — BODY FLUID CELL COUNT WITH DIFFERENTIAL
Eos, Fluid: 0 %
LYMPHS FL: 34 %
Monocyte-Macrophage-Serous Fluid: 28 %
Neutrophil Count, Fluid: 38 %
WBC FLUID: 79 uL

## 2017-05-04 LAB — PROTEIN, PLEURAL OR PERITONEAL FLUID

## 2017-05-04 LAB — ALBUMIN, PLEURAL OR PERITONEAL FLUID: Albumin, Fluid: <1 g/dL

## 2017-05-04 NOTE — Telephone Encounter (Signed)
[facility] called and said they could not get blood work. Two people tried.

## 2017-05-05 ENCOUNTER — Encounter: Payer: Self-pay | Admitting: Family

## 2017-05-05 ENCOUNTER — Ambulatory Visit: Payer: Medicare Other | Attending: Family | Admitting: Family

## 2017-05-05 ENCOUNTER — Other Ambulatory Visit: Payer: Self-pay

## 2017-05-05 VITALS — BP 102/51 | HR 69 | Resp 18 | Ht 65.0 in | Wt 148.0 lb

## 2017-05-05 DIAGNOSIS — Z951 Presence of aortocoronary bypass graft: Secondary | ICD-10-CM | POA: Insufficient documentation

## 2017-05-05 DIAGNOSIS — R079 Chest pain, unspecified: Secondary | ICD-10-CM | POA: Insufficient documentation

## 2017-05-05 DIAGNOSIS — Z8249 Family history of ischemic heart disease and other diseases of the circulatory system: Secondary | ICD-10-CM | POA: Diagnosis not present

## 2017-05-05 DIAGNOSIS — K7460 Unspecified cirrhosis of liver: Secondary | ICD-10-CM | POA: Insufficient documentation

## 2017-05-05 DIAGNOSIS — I110 Hypertensive heart disease with heart failure: Secondary | ICD-10-CM | POA: Insufficient documentation

## 2017-05-05 DIAGNOSIS — I2510 Atherosclerotic heart disease of native coronary artery without angina pectoris: Secondary | ICD-10-CM | POA: Diagnosis not present

## 2017-05-05 DIAGNOSIS — Z79899 Other long term (current) drug therapy: Secondary | ICD-10-CM | POA: Diagnosis not present

## 2017-05-05 DIAGNOSIS — E119 Type 2 diabetes mellitus without complications: Secondary | ICD-10-CM | POA: Diagnosis not present

## 2017-05-05 DIAGNOSIS — I714 Abdominal aortic aneurysm, without rupture: Secondary | ICD-10-CM | POA: Diagnosis not present

## 2017-05-05 DIAGNOSIS — Z87891 Personal history of nicotine dependence: Secondary | ICD-10-CM | POA: Diagnosis not present

## 2017-05-05 DIAGNOSIS — G8929 Other chronic pain: Secondary | ICD-10-CM | POA: Insufficient documentation

## 2017-05-05 DIAGNOSIS — Z9889 Other specified postprocedural states: Secondary | ICD-10-CM | POA: Insufficient documentation

## 2017-05-05 DIAGNOSIS — Z8679 Personal history of other diseases of the circulatory system: Secondary | ICD-10-CM | POA: Insufficient documentation

## 2017-05-05 DIAGNOSIS — I480 Paroxysmal atrial fibrillation: Secondary | ICD-10-CM

## 2017-05-05 DIAGNOSIS — I10 Essential (primary) hypertension: Secondary | ICD-10-CM

## 2017-05-05 DIAGNOSIS — E785 Hyperlipidemia, unspecified: Secondary | ICD-10-CM | POA: Insufficient documentation

## 2017-05-05 DIAGNOSIS — I5032 Chronic diastolic (congestive) heart failure: Secondary | ICD-10-CM | POA: Diagnosis not present

## 2017-05-05 DIAGNOSIS — R0602 Shortness of breath: Secondary | ICD-10-CM | POA: Insufficient documentation

## 2017-05-05 DIAGNOSIS — R188 Other ascites: Secondary | ICD-10-CM

## 2017-05-05 DIAGNOSIS — I4891 Unspecified atrial fibrillation: Secondary | ICD-10-CM | POA: Diagnosis not present

## 2017-05-05 LAB — CYTOLOGY - NON PAP

## 2017-05-05 NOTE — Progress Notes (Signed)
Patient ID: Kyle Balesrvin C Gartley Sr., male    DOB: [birth year], 83 y.o.   MRN: [id number]  HPI  [name] is a 83 y/o male with a history of AAA, CAD, diabetes, hyperlipidemia, HTN, liver cirrhosis, atrial fibrillation, previous tobacco use and chronic heart failure.  Echo report from 04/13/17 reviewed and showed an EF of 55-60% along with moderate MR/PR and moderate-severe TR.   Was in the ED 04/14/17 due to chest pain where he was treated and released. Admitted 04/10/17 due to GI bleed with melena. Warfarin was stopped and IV protonix was changed to PO protonix. GI and cardiology consults were obtained. Given PRBC's. Paracentesis was done and 3L of fluid was withdrawn. Discharged to SNF after 4 days. Was in the ED 03/28/17 due to peripheral edema where he was treated and released. Was in the ED 03/09/17 due to peripheral edema. IV lasix was given and he was discharged home.   He presents today for his initial visit with a chief complaint of moderate shortness of breath upon minimal exertion. He describes this as chronic in nature having been present for several months with varying levels of severity. He has associated fatigue, decreased appetite, chronic chest pain, edema in legs/arm, easy bruising and difficulty sleeping along with this. He denies any chest tightness, palpitations, dizziness or abdominal distention.   Past Medical History:  Diagnosis Date  . A-fib (HCC)   . AAA (abdominal aortic aneurysm) (HCC)   . Bradycardia   . CAD (coronary artery disease)   . CHF (congestive heart failure) (HCC)   . Cirrhosis (HCC)   . DM (diabetes mellitus) (HCC)   . Hyperlipidemia   . Hypertension   . Pancreatitis    Past Surgical History:  Procedure Laterality Date  . COLONOSCOPY  5+ years ago per wife   + polyps  . CORONARY ARTERY BYPASS GRAFT     x2  . ENDOVASCULAR REPAIR/STENT GRAFT     Family History  Problem Relation Age of Onset  . CAD Father    Social History   Tobacco Use  . Smoking  status: Former Smoker    Types: Cigarettes  . Smokeless tobacco: Former NeurosurgeonUser    Types: Snuff  Substance Use Topics  . Alcohol use: No   No Known Allergies Prior to Admission medications   Medication Sig Start Date End Date Taking? Authorizing Provider  Acetaminophen (TYLENOL) 325 MG CAPS Take 650 tablets by mouth as needed.   Yes [provider]  Amino Acids-Protein Hydrolys (FEEDING SUPPLEMENT, PRO-STAT SUGAR FREE 64,) LIQD Take 30 mLs by mouth 3 (three) times daily with meals.   Yes [provider]  bisacodyl (DULCOLAX) 5 MG EC tablet Take 1 tablet (5 mg total) by mouth daily as needed for moderate constipation. 04/13/17  Yes Kyle Rollins, Qing, MD  furosemide (LASIX) 20 MG tablet Take 1 tablet (20 mg total) by mouth daily. 04/13/17  Yes Kyle Rollins, Qing, MD  gentamicin (GARAMYCIN) 0.3 % ophthalmic solution Place 2 drops into both eyes every 4 (four) hours.   Yes [provider]  guaifenesin (ROBITUSSIN) 100 MG/5ML syrup Take 200 mg by mouth 3 (three) times daily as needed for cough.   Yes [provider]  KLOR-CON M20 20 MEQ tablet Take 1 tablet by mouth 2 (two) times daily. 03/13/17  Yes [provider]  lovastatin (MEVACOR) 40 MG tablet Take 2 tablets by mouth daily.  02/07/17  Yes [provider]  metoprolol tartrate (LOPRESSOR) 25 MG tablet Take 0.5  tablets (12.5 mg total) by mouth 2 (two) times daily. 04/13/17  Yes Kyle Rollins, Qing, MD  mirtazapine (REMERON) 7.5 MG tablet Take 7.5 mg by mouth at bedtime.   Yes [provider]  Multiple Vitamin (MULTIVITAMIN) tablet Take 1 tablet by mouth daily.   Yes [provider]  nitroGLYCERIN (NITROSTAT) 0.4 MG SL tablet Place 1 tablet under the tongue. Every 5 minutes as needed for chest pain. May take up to 3 doses. 09/30/16 09/30/17 Yes [provider]  pantoprazole (PROTONIX) 40 MG tablet Take 1 tablet (40 mg total) by mouth daily. 04/13/17  Yes Kyle Rollins, Qing, MD  silver sulfADIAZINE  (SILVADENE) 1 % cream Apply 1 application topically daily.   Yes [provider]  vitamin C (ASCORBIC ACID) 250 MG tablet Take 250 mg by mouth daily.   Yes [provider]    Review of Systems  Constitutional: Positive for appetite change (decreased) and fatigue.  HENT: Negative for congestion, postnasal drip and sore throat.   Eyes: Negative.   Respiratory: Positive for shortness of breath. Negative for chest tightness.   Cardiovascular: Positive for chest pain (chronic pain) and leg swelling. Negative for palpitations.  Gastrointestinal: Negative for abdominal distention and abdominal pain.  Endocrine: Negative.   Genitourinary: Negative.   Musculoskeletal: Positive for back pain. Negative for neck pain.  Skin: Positive for color change (redness of left lower arm) and wound (left lower arm weeping).  Allergic/Immunologic: Negative.   Neurological: Negative for dizziness and light-headedness.  Hematological: Negative for adenopathy. Bruises/bleeds easily.  Psychiatric/Behavioral: Positive for sleep disturbance (not sleeping well). Negative for dysphoric mood. The patient is not nervous/anxious.    Vitals:   05/05/17 1111  BP: (!) 102/51  Pulse: 69  Resp: 18  SpO2: 98%  Weight: 148 lb (67.1 kg)  Height: 5\' 5"  (1.651 m)   Wt Readings from Last 3 Encounters:  05/05/17 148 lb (67.1 kg)  04/27/17 163 lb (73.9 kg)  04/14/17 177 lb (80.3 kg)   Lab Results  Component Value Date   CREATININE 1.46 (H) 04/14/2017   CREATININE 1.49 (H) 04/14/2017   CREATININE 1.65 (H) 04/13/2017   Physical Exam  Constitutional: He is oriented to person, place, and time. He appears well-developed and well-nourished.  HENT:  Head: Normocephalic and atraumatic.  Neck: Normal range of motion. Neck supple. No JVD present.  Cardiovascular: Normal rate and regular rhythm.  Pulmonary/Chest: Effort normal. He has no wheezes. He has no rales.  Abdominal: Soft. He exhibits no distension.  There is no tenderness.  Musculoskeletal: He exhibits edema (2+ pitting edema in bilateral lower legs; edema noted in left lower arm as well). He exhibits no tenderness.  Neurological: He is alert and oriented to person, place, and time.  Skin: Skin is warm. There is erythema (left lower arm).  Psychiatric: He has a normal mood and affect. His behavior is normal.  Nursing note and vitals reviewed.  Assessment & Plan:  1: Chronic heart failure with preserved ejection fraction- - NYHA class III - fluid overloaded today although weight is down from hospital discharge - not being weighed daily so an order was written for him to be weighed daily and for staff to call for an overnight weight gain of >2 pounds or a weekly weight gain of >5 pounds - order written for TED hose to be applied bilaterally along with elevation of legs - cardiology recently ordered keflex for left lower arm cellulitis but it hasn't been started yet - will increase his  furosemide to 40mg  daily and continue potassium as BID - BMP to be drawn next week at [facility] and results faxed to [place] - not adding salt to his food - BNP from 04/26/17 was elevated at 1254.0  2: Atrial fibrillation- - saw cardiology Kyle Rollins) 05/03/17; returns 05/25/17 - currently rate controlled with metoprolol tartrate - warfarin currently being held due to recent GIB  3: Liver cirrhosis- - saw GI Kyle Rollins) 04/27/17 - had paracentesis done yesterday and patient reports having 4.5 L removed  4: HTN- - BP on the low side but patient without dizziness; monitor BP with increase of furosemide - seeing PCP (Kyle Rollins) at [facility] right now; normally sees Kyle Rollins - BMP from 04/14/17 reviewed and showed sodium 134, potassium 3.9 and GFR 41  Facility medication list was reviewed.   Return in 1 month or sooner for any questions/problems before then.

## 2017-05-05 NOTE — Patient Instructions (Signed)
Begin weighing daily and call for an overnight weight gain of > 2 pounds or a weekly weight gain of >5 pounds. 

## 2017-05-06 LAB — PROTEIN, BODY FLUID (OTHER): TOTAL PROTEIN, BODY FLUID OTHER: 0.8 g/dL

## 2017-05-08 ENCOUNTER — Other Ambulatory Visit: Payer: Self-pay | Admitting: Family

## 2017-05-08 NOTE — Telephone Encounter (Signed)
Left message to contact office regarding his fathers referral for nephrology for renal insufficiency.

## 2017-05-16 NOTE — Telephone Encounter (Signed)
Left message with son to contact office regarding nephrology referral.

## 2017-05-22 ENCOUNTER — Other Ambulatory Visit: Payer: Self-pay

## 2017-05-22 DIAGNOSIS — N289 Disorder of kidney and ureter, unspecified: Secondary | ICD-10-CM

## 2017-05-22 NOTE — Telephone Encounter (Signed)
-----   Message from Pasty SpillersVarnita B Tahiliani, MD sent at 04/27/2017  4:51 PM EST ----- Eunice Blaseebbie,  Can you refer this patient to nephrology for renal insufficiency please.

## 2017-05-22 NOTE — Telephone Encounter (Signed)
-----   Message from Varnita B Tahiliani, MD sent at 04/27/2017  4:51 PM EST ----- Kameria Canizares,  Can you refer this patient to nephrology for renal insufficiency please.  

## 2017-06-02 NOTE — Progress Notes (Deleted)
Patient ID: Kyle Balesrvin C Mckown Sr., male    DOB: December 21, 1927, 84 y.o.   MRN: [id number]  HPI  [name] is a 84 y/o male with a history of AAA, CAD, diabetes, hyperlipidemia, HTN, liver cirrhosis, atrial fibrillation, previous tobacco use and chronic heart failure.  Echo report from 04/13/17 reviewed and showed an EF of 55-60% along with moderate MR/PR and moderate-severe TR.   Was in the ED 04/14/17 due to chest pain where he was treated and released. Admitted 04/10/17 due to GI bleed with melena. Warfarin was stopped and IV protonix was changed to PO protonix. GI and cardiology consults were obtained. Given PRBC's. Paracentesis was done and 3L of fluid was withdrawn. Discharged to SNF after 4 days. Was in the ED 03/28/17 due to peripheral edema where he was treated and released. Was in the ED 03/09/17 due to peripheral edema. IV lasix was given and he was discharged home.   He presents today for a follow-up visit with a chief complaint of   Past Medical History:  Diagnosis Date  . A-fib (HCC)   . AAA (abdominal aortic aneurysm) (HCC)   . Bradycardia   . CAD (coronary artery disease)   . CHF (congestive heart failure) (HCC)   . Cirrhosis (HCC)   . DM (diabetes mellitus) (HCC)   . Hyperlipidemia   . Hypertension   . Pancreatitis    Past Surgical History:  Procedure Laterality Date  . COLONOSCOPY  5+ years ago per wife   + polyps  . CORONARY ARTERY BYPASS GRAFT     x2  . ENDOVASCULAR REPAIR/STENT GRAFT     Family History  Problem Relation Age of Onset  . CAD Father    Social History   Tobacco Use  . Smoking status: Former Smoker    Types: Cigarettes  . Smokeless tobacco: Former NeurosurgeonUser    Types: Snuff  Substance Use Topics  . Alcohol use: No   No Known Allergies   Review of Systems  Constitutional: Positive for appetite change (decreased) and fatigue.  HENT: Negative for congestion, postnasal drip and sore throat.   Eyes: Negative.   Respiratory: Positive for shortness of  breath. Negative for chest tightness.   Cardiovascular: Positive for chest pain (chronic pain) and leg swelling. Negative for palpitations.  Gastrointestinal: Negative for abdominal distention and abdominal pain.  Endocrine: Negative.   Genitourinary: Negative.   Musculoskeletal: Positive for back pain. Negative for neck pain.  Skin: Positive for color change (redness of left lower arm) and wound (left lower arm weeping).  Allergic/Immunologic: Negative.   Neurological: Negative for dizziness and light-headedness.  Hematological: Negative for adenopathy. Bruises/bleeds easily.  Psychiatric/Behavioral: Positive for sleep disturbance (not sleeping well). Negative for dysphoric mood. The patient is not nervous/anxious.     Physical Exam  Constitutional: He is oriented to person, place, and time. He appears well-developed and well-nourished.  HENT:  Head: Normocephalic and atraumatic.  Neck: Normal range of motion. Neck supple. No JVD present.  Cardiovascular: Normal rate and regular rhythm.  Pulmonary/Chest: Effort normal. He has no wheezes. He has no rales.  Abdominal: Soft. He exhibits no distension. There is no tenderness.  Musculoskeletal: He exhibits edema (2+ pitting edema in bilateral lower legs; edema noted in left lower arm as well). He exhibits no tenderness.  Neurological: He is alert and oriented to person, place, and time.  Skin: Skin is warm. There is erythema (left lower arm).  Psychiatric: He has a normal mood and affect. His behavior  is normal.  Nursing note and vitals reviewed.  Assessment & Plan:  1: Chronic heart failure with preserved ejection fraction- - NYHA class III - fluid overloaded today although weight is down from hospital discharge - not being weighed daily so an order was written for him to be weighed daily and for staff to call for an overnight weight gain of >2 pounds or a weekly weight gain of >5 pounds - order written for TED hose to be applied  bilaterally along with elevation of legs - cardiology recently ordered keflex for left lower arm cellulitis but it hasn't been started yet - furosemide increased at his last visit -  - not adding salt to his food - BNP from 04/26/17 was elevated at 1254.0  2: Atrial fibrillation- - saw cardiology Kyle Rollins) 05/03/17 - currently rate controlled with metoprolol tartrate - warfarin currently being held due to recent GIB  3: Liver cirrhosis- - saw GI Kyle Rollins) 04/27/17 - previous paracentesis done with 4.5 L removed  4: HTN- - BP on the low side but patient without dizziness; monitor BP with increase of furosemide - seeing PCP (Kyle Rollins) at [facility] right now; normally sees Kyle Rollins - BMP from 05/11/17 ([facility]) reviewed and showed sodium 135, potassium 4.9 and GFR 56  Facility medication list was reviewed.

## 2017-06-05 ENCOUNTER — Ambulatory Visit: Payer: Medicare Other | Admitting: Family

## 2017-06-05 ENCOUNTER — Telehealth: Payer: Self-pay | Admitting: Family

## 2017-06-05 NOTE — Telephone Encounter (Signed)
Patient did not show for his [facility] appointment on 06/05/17. Will attempt to reschedule.

## 2017-06-09 DEATH — deceased

## 2018-03-08 IMAGING — DX DG ELBOW COMPLETE 3+V*L*
4 series · 4 of 4 positions shown · non-contrast
Comparison: None in PACs

CLINICAL DATA: Patient slipped out of a chair yesterday and has
bruising and skin tear is over the left elbow.

EXAM:
LEFT ELBOW - COMPLETE 3+ VIEW

[elbow ap]
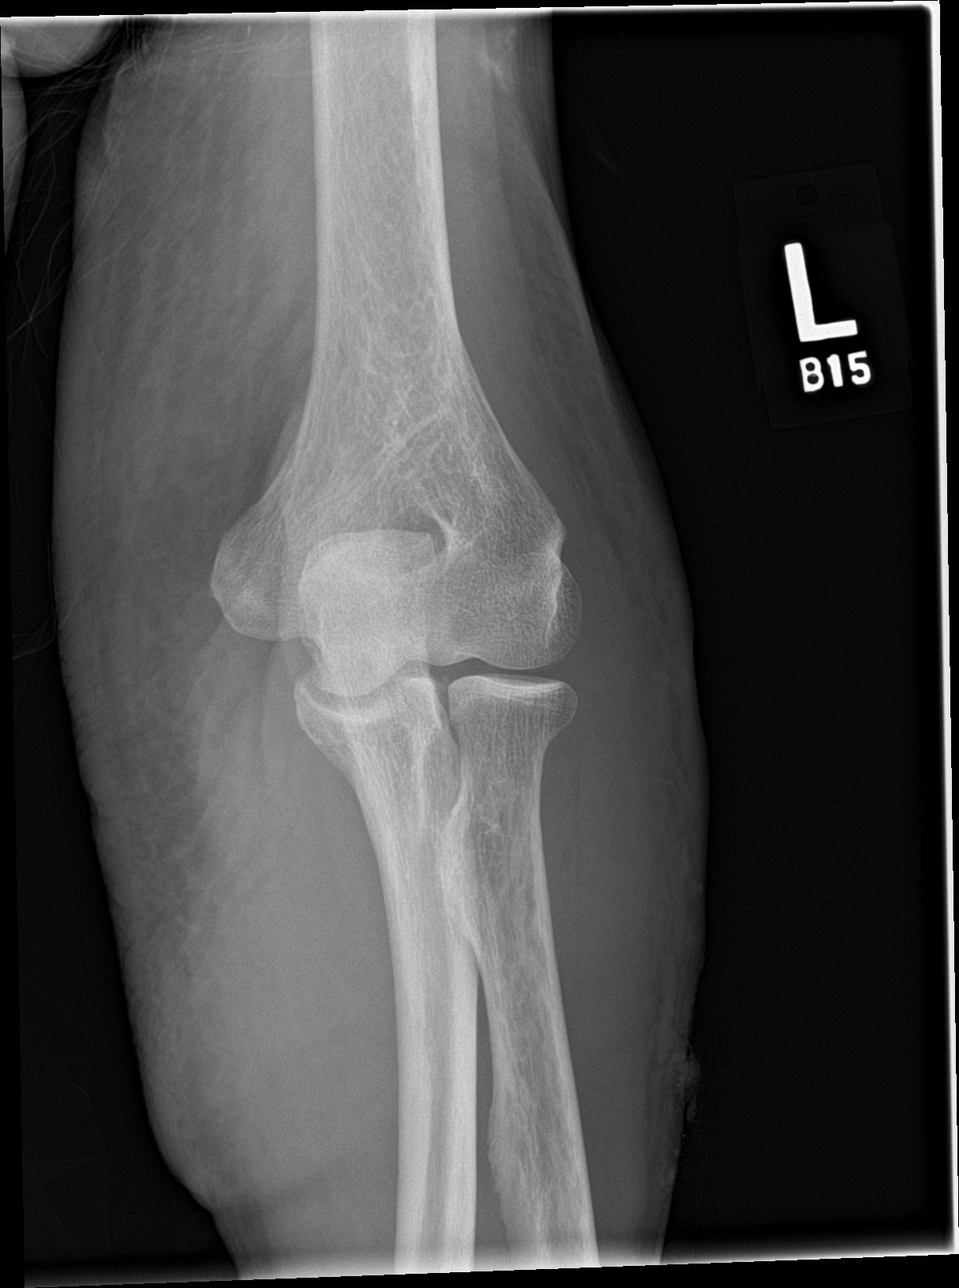

[elbow obl (1 of 2)]
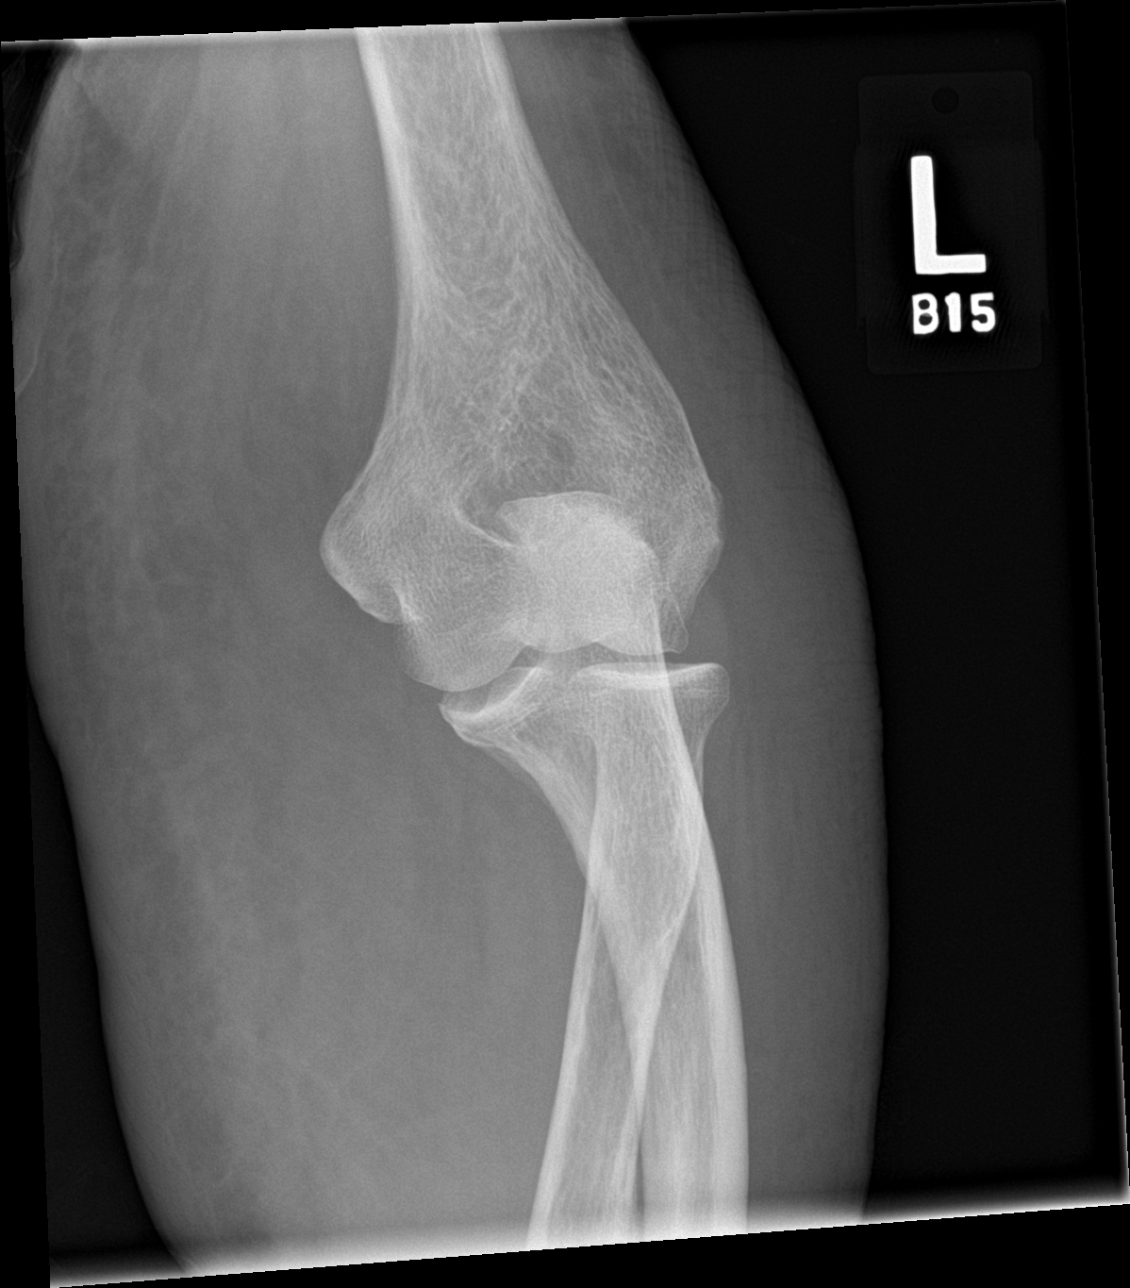

[elbow obl (2 of 2)]
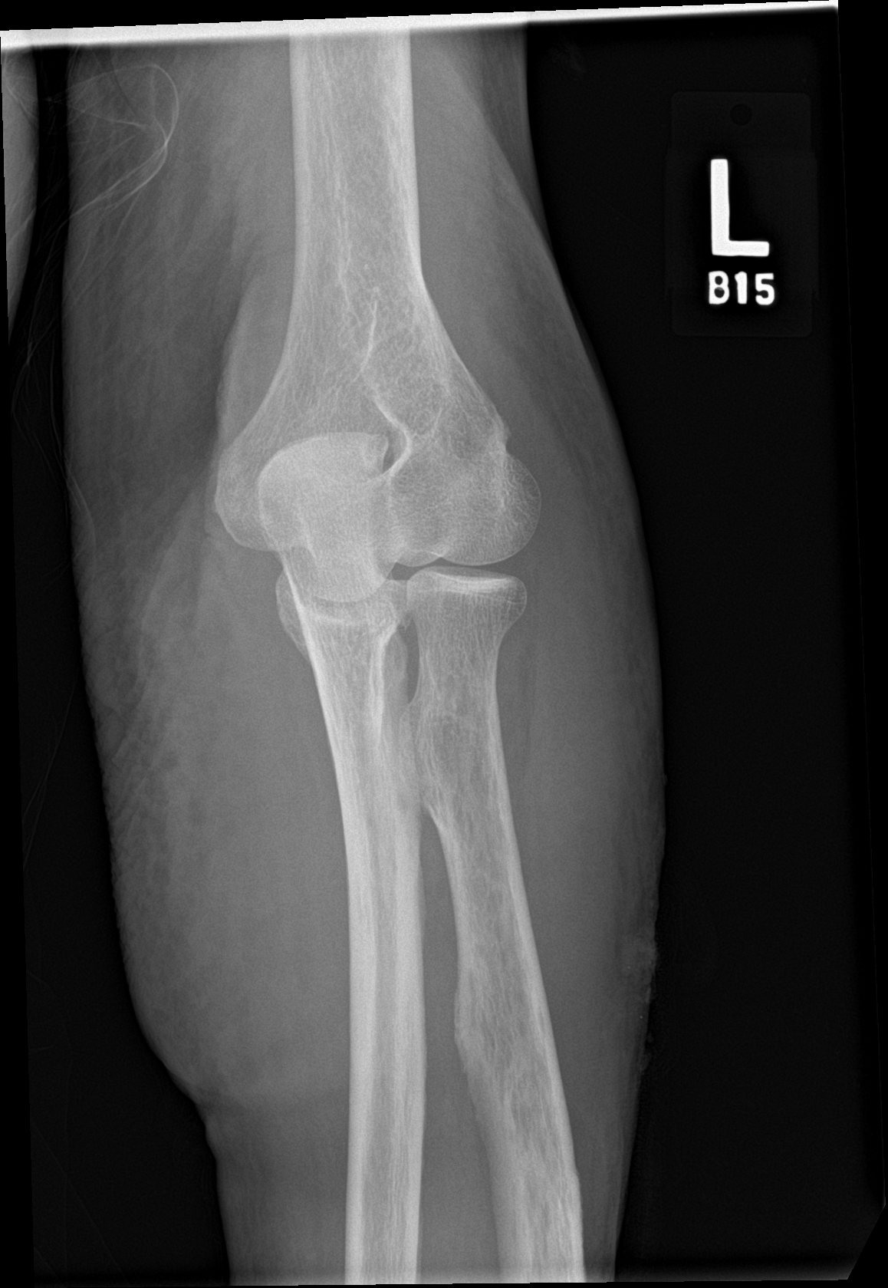

[elbow lat]
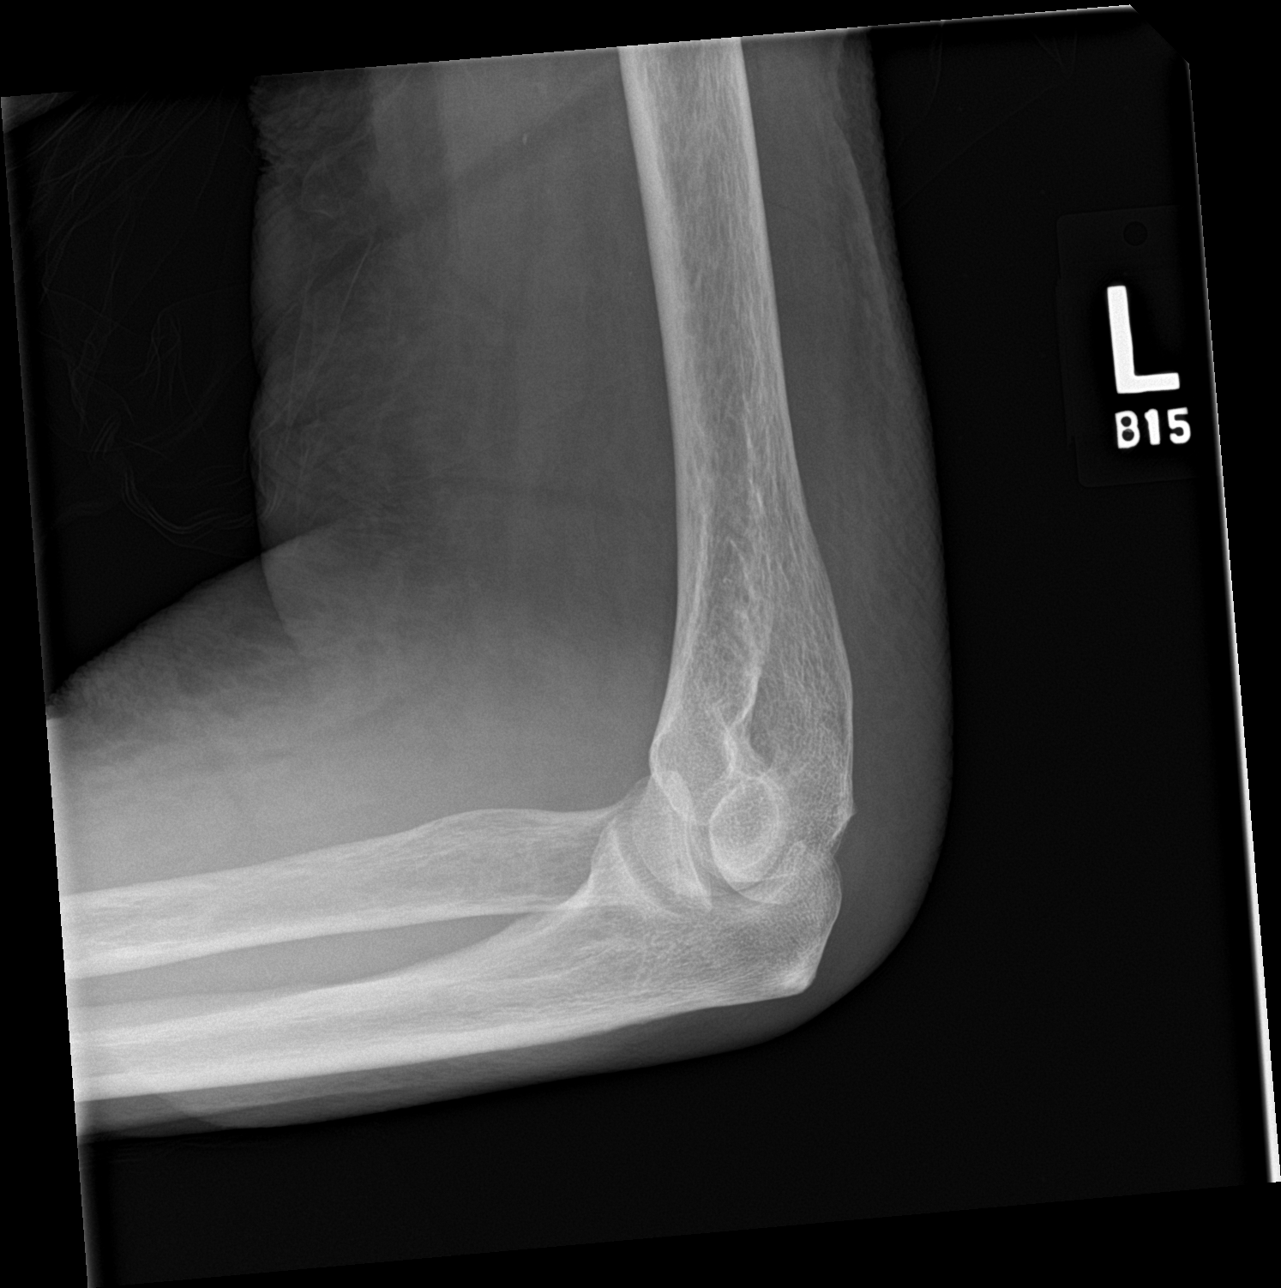

[4 of 4 positions shown; findings below may reference images not displayed]

FINDINGS: The bones are subjectively adequately mineralized. The radial head
is intact. The olecranon and distal humerus are intact. There is no
joint effusion. Mild soft tissue swelling posteriorly may reflect
olecranon bursitis.
IMPRESSION: There is no acute fracture nor dislocation of the left elbow.
